# Patient Record
Sex: Male | Born: 1960 | Race: White | Hispanic: No | Marital: Married | Smoking: Former smoker
Health system: Southern US, Community
[De-identification: ages and names within clinical notes are randomized; demographics above are authoritative.]

## PROBLEM LIST (undated history)

## (undated) DIAGNOSIS — E785 Hyperlipidemia, unspecified: Secondary | ICD-10-CM

## (undated) DIAGNOSIS — K219 Gastro-esophageal reflux disease without esophagitis: Secondary | ICD-10-CM

## (undated) DIAGNOSIS — B059 Measles without complication: Secondary | ICD-10-CM

## (undated) DIAGNOSIS — F411 Generalized anxiety disorder: Secondary | ICD-10-CM

## (undated) DIAGNOSIS — Z889 Allergy status to unspecified drugs, medicaments and biological substances status: Secondary | ICD-10-CM

## (undated) HISTORY — DX: Measles without complication: B05.9

## (undated) HISTORY — DX: Allergy status to unspecified drugs, medicaments and biological substances: Z88.9

## (undated) HISTORY — DX: Hyperlipidemia, unspecified: E78.5

## (undated) HISTORY — DX: Generalized anxiety disorder: F41.1

## (undated) HISTORY — DX: Gastro-esophageal reflux disease without esophagitis: K21.9

---

## 2006-02-09 ENCOUNTER — Ambulatory Visit: Payer: Self-pay | Admitting: Internal Medicine

## 2006-03-11 ENCOUNTER — Ambulatory Visit: Payer: Self-pay | Admitting: Internal Medicine

## 2011-05-26 ENCOUNTER — Other Ambulatory Visit (INDEPENDENT_AMBULATORY_CARE_PROVIDER_SITE_OTHER): Payer: Self-pay

## 2011-05-26 ENCOUNTER — Ambulatory Visit (INDEPENDENT_AMBULATORY_CARE_PROVIDER_SITE_OTHER): Payer: Self-pay | Admitting: Internal Medicine

## 2011-05-26 ENCOUNTER — Ambulatory Visit (INDEPENDENT_AMBULATORY_CARE_PROVIDER_SITE_OTHER)
Admission: RE | Admit: 2011-05-26 | Discharge: 2011-05-26 | Disposition: A | Discharge status: Home / Self Care | Payer: Self-pay | Source: Ambulatory Visit | Attending: Internal Medicine | Admitting: Internal Medicine

## 2011-05-26 ENCOUNTER — Encounter: Payer: Self-pay | Admitting: Internal Medicine

## 2011-05-26 VITALS — BP 128/78 | HR 80 | Temp 98.2°F | Ht 69.0 in | Wt 183.0 lb

## 2011-05-26 DIAGNOSIS — R0989 Other specified symptoms and signs involving the circulatory and respiratory systems: Secondary | ICD-10-CM

## 2011-05-26 DIAGNOSIS — R0600 Dyspnea, unspecified: Secondary | ICD-10-CM

## 2011-05-26 DIAGNOSIS — J310 Chronic rhinitis: Secondary | ICD-10-CM | POA: Insufficient documentation

## 2011-05-26 DIAGNOSIS — R0609 Other forms of dyspnea: Secondary | ICD-10-CM

## 2011-05-26 LAB — CBC WITH DIFFERENTIAL/PLATELET
Basophils Relative: 0.5 % (ref 0.0–3.0)
Eosinophils Relative: 3.8 % (ref 0.0–5.0)
HCT: 37.8 % — ABNORMAL LOW (ref 39.0–52.0)
Lymphs Abs: 2 10*3/uL (ref 0.7–4.0)
MCV: 94.7 fl (ref 78.0–100.0)
Monocytes Absolute: 0.4 10*3/uL (ref 0.1–1.0)
Monocytes Relative: 6.9 % (ref 3.0–12.0)
Neutrophils Relative %: 57.3 % (ref 43.0–77.0)
RBC: 4 Mil/uL — ABNORMAL LOW (ref 4.22–5.81)
WBC: 6.3 10*3/uL (ref 4.5–10.5)

## 2011-05-26 LAB — BASIC METABOLIC PANEL
Chloride: 103 mEq/L (ref 96–112)
Potassium: 3.8 mEq/L (ref 3.5–5.1)

## 2011-05-26 MED ORDER — PREDNISONE (PAK) 10 MG PO TABS: ORAL_TABLET | ORAL | Status: AC

## 2011-05-26 NOTE — Assessment & Plan Note (Signed)
See instructions for specific recommendations which were reviewed directly with the patient who was given a copy with highlighter outlining the key components.  rx with one cycle of prednisone since not compliant with topical rx in the past

## 2011-05-26 NOTE — Assessment & Plan Note (Signed)
Symptoms are markedly disproportionate to objective findings and not clear this is a lung problem but pt does appear to have difficult airway management issues.   DDX of  difficult airways managment all start with A and  include Adherence, Ace Inhibitors, Acid Reflux, Active Sinus Disease, Alpha 1 Antitripsin deficiency, Anxiety masquerading as Airways dz,  ABPA,  allergy(esp in young), Aspiration (esp in elderly), Adverse effects of DPI,  Active smokers, plus two Bs  = Bronchiectasis and Beta blocker use..and one C= CHF   Active or acute sinusitis.  Allergy  and anxiety appear to be highest on the ddx so rec proceed with sinus ct and allergy profile

## 2011-05-26 NOTE — Progress Notes (Signed)
  Subjective:    Patient ID: Adrian Ayers, male    DOB: 06/30/1961, 50 y.o.   MRN: [id number]  HPI  68 yowm quit smoking 2007 and felt better also changed jobs then progressive doe so self referred to pulmonary clinic 05/26/2011   05/26/2011 Initial pulmonary office eval in EMR era cc indolent osnet x 5 years progressively worsening doe across a parking lot no real fluctuation though worse when humid but Sleeping ok without nocturnal  or early am exacerbation  of respiratory  c/o's.    Also denies any obvious fluctuation of symptoms with weather or environmental changes or other aggravating or alleviating factors except as outlined above.  Mod assoc nasal congestion/ obstructive symptoms not previously adeherent with nasal steroids. No classic lateralizing pleuritic or ex cp.     Review of Systems  Constitutional: Negative for fever, chills, activity change, appetite change and unexpected weight change.  HENT: Positive for congestion, rhinorrhea, sneezing and postnasal drip. Negative for sore throat, trouble swallowing, dental problem and voice change.   Eyes: Positive for visual disturbance.  Respiratory: Positive for shortness of breath. Negative for cough and choking.   Cardiovascular: Positive for chest pain. Negative for leg swelling.  Gastrointestinal: Negative for nausea, vomiting and abdominal pain.  Genitourinary: Negative for difficulty urinating.  Musculoskeletal: Positive for arthralgias.  Skin: Positive for rash.  Psychiatric/Behavioral: Negative for behavioral problems and confusion.       Objective:   Physical Exam  amb wm nad with nasal tone to voice  Wt 183 05/26/2011   HEENT: nl dentition,and orophanx.  Severe bilateral turbinate edema Nl external ear canals without cough reflex   NECK :  without JVD/Nodes/TM/ nl carotid upstrokes bilaterally   LUNGS: no acc muscle use, clear to A and P bilaterally without cough on insp or exp maneuvers   CV:  RRR  no  s3 or murmur or increase in P2, no edema   ABD:  soft and nontender with nl excursion in the supine position. No bruits or organomegaly, bowel sounds nl  MS:  warm without deformities, calf tenderness, cyanosis or clubbing  SKIN: warm and dry without lesions    NEURO:  alert, approp, no deficits       Assessment & Plan:

## 2011-05-26 NOTE — Patient Instructions (Signed)
Prednisone 10 mg take  4 each am x 2 days,   2 each am x 2 days,  1 each am x2days and stop  Please remember to go to the lab and x-ray department downstairs for your tests - we will call you with the results when then are available.   Please schedule a follow up office visit in 4 weeks, sooner if needed with pft's on return

## 2011-05-26 NOTE — Progress Notes (Signed)
Quick Note:  Spoke with pt and notified of results per Dr. Wert. Pt verbalized understanding and denied any questions.  ______ 

## 2011-05-27 ENCOUNTER — Ambulatory Visit (INDEPENDENT_AMBULATORY_CARE_PROVIDER_SITE_OTHER)
Admission: RE | Admit: 2011-05-27 | Discharge: 2011-05-27 | Disposition: A | Discharge status: Home / Self Care | Payer: Self-pay | Source: Ambulatory Visit | Attending: Internal Medicine | Admitting: Internal Medicine

## 2011-05-27 DIAGNOSIS — R0609 Other forms of dyspnea: Secondary | ICD-10-CM

## 2011-05-27 DIAGNOSIS — R0600 Dyspnea, unspecified: Secondary | ICD-10-CM

## 2011-05-27 DIAGNOSIS — R0989 Other specified symptoms and signs involving the circulatory and respiratory systems: Secondary | ICD-10-CM

## 2011-05-27 LAB — ALLERGY PROFILE REGION II-DC, DE, MD, ~~LOC~~, VA
Allergen, D pternoyssinus,d7: 0.11 kU/L (ref <0.35)
Alternaria Alternata: <0.1 kU/L (ref <0.35)
Aspergillus fumigatus, IgG: <0.1 kU/L (ref <0.35)
Bermuda Grass: 49.9 kU/L — ABNORMAL HIGH (ref <0.35)
Cat Dander: 1.72 kU/L — ABNORMAL HIGH (ref <0.35)
Cladosporium Herbarum: <0.1 kU/L (ref <0.35)
Dog Dander: 0.9 kU/L — ABNORMAL HIGH (ref <0.35)
IgE (Immunoglobulin E), Serum: 544 IU/mL — ABNORMAL HIGH (ref 0.0–180.0)
Johnson Grass: 38.2 kU/L — ABNORMAL HIGH (ref <0.35)
Lamb's Quarters: 8.99 kU/L — ABNORMAL HIGH (ref <0.35)

## 2011-05-27 NOTE — Progress Notes (Signed)
Quick Note:  Spoke with pt and notified of results per Dr. Wert. Pt verbalized understanding and denied any questions.  ______ 

## 2011-05-31 ENCOUNTER — Telehealth: Payer: Self-pay | Admitting: Internal Medicine

## 2011-05-31 NOTE — Telephone Encounter (Signed)
Spoke with pt and notified of results per Dr. Sherene Sires. Pt states that he is still having a lot of tightness and soreness in his chest and feels SOB now. He states that at the time of the last appt, he was feeling okay and is concerned b/c now having symptoms. I advised needs ov for eval and sched him to see [facility] at 3:15 tomorrow and advised ED sooner if needed. Pt verbalized understanding.

## 2011-05-31 NOTE — Telephone Encounter (Signed)
LMTCB

## 2011-06-01 ENCOUNTER — Ambulatory Visit: Payer: Self-pay | Admitting: Pulmonary Disease

## 2011-06-17 ENCOUNTER — Telehealth: Payer: Self-pay | Admitting: Internal Medicine

## 2011-06-17 NOTE — Telephone Encounter (Signed)
Error.  Made alternate appointment for pt.  Adrian Ayers

## 2011-06-23 ENCOUNTER — Ambulatory Visit (INDEPENDENT_AMBULATORY_CARE_PROVIDER_SITE_OTHER): Payer: Self-pay | Admitting: Internal Medicine

## 2011-06-23 DIAGNOSIS — R0600 Dyspnea, unspecified: Secondary | ICD-10-CM

## 2011-06-23 DIAGNOSIS — R0989 Other specified symptoms and signs involving the circulatory and respiratory systems: Secondary | ICD-10-CM

## 2011-06-23 DIAGNOSIS — R0609 Other forms of dyspnea: Secondary | ICD-10-CM

## 2011-06-23 LAB — PULMONARY FUNCTION TEST

## 2011-06-23 NOTE — Progress Notes (Signed)
PFT done today. 

## 2011-06-25 ENCOUNTER — Encounter: Payer: Self-pay | Admitting: Internal Medicine

## 2011-07-02 ENCOUNTER — Ambulatory Visit: Payer: Self-pay | Admitting: Internal Medicine

## 2011-07-02 ENCOUNTER — Telehealth: Payer: Self-pay | Admitting: Internal Medicine

## 2011-07-02 NOTE — Telephone Encounter (Signed)
Per MW- PFT's were reviewed and are inconclusive- needs to keep ov with MW 07/06/11 to discuss in further detail.  ATC pt and inform and NA, unable to leave msg since mailbox was full, WCB.

## 2011-07-06 ENCOUNTER — Encounter: Payer: Self-pay | Admitting: Internal Medicine

## 2011-07-06 NOTE — Progress Notes (Signed)
  Subjective:    Patient ID: Adrian Ayers, male    DOB: 05/04/1961, 50 y.o.   MRN: [id number]  HPI  69 yowm quit smoking 2007 and felt better also changed jobs then progressive doe so self referred to pulmonary clinic 05/26/2011   05/26/2011 Initial pulmonary office eval in EMR era cc indolent osnet x 5 years progressively worsening doe across a parking lot no real fluctuation though worse when humid but  rec Prednisone 10 mg take  4 each am x 2 days,   2 each am x 2 days,  1 each am x2days and stop Please remember to go to the lab and x-ray department downstairs for your tests - we will call you with the results when then are available.  Please schedule a follow up office visit in 4 weeks, sooner if needed with pft's on retur    07/06/2011 f/u ov/Keneshia Tena cc  Sleeping ok without nocturnal  or early am exacerbation  of respiratory  c/o's.    Also denies any obvious fluctuation of symptoms with weather or environmental changes or other aggravating or alleviating factors except as outlined above.  Mod assoc nasal congestion/ obstructive symptoms not previously adeherent with nasal steroids. No classic lateralizing pleuritic or ex cp.          Objective:   Physical Exam  amb wm nad with nasal tone to voice  Wt 183 05/26/2011 > 07/06/2011   HEENT: nl dentition,and orophanx.  Severe bilateral turbinate edema Nl external ear canals without cough reflex   NECK :  without JVD/Nodes/TM/ nl carotid upstrokes bilaterally   LUNGS: no acc muscle use, clear to A and P bilaterally without cough on insp or exp maneuvers   CV:  RRR  no s3 or murmur or increase in P2, no edema   ABD:  soft and nontender with nl excursion in the supine position. No bruits or organomegaly, bowel sounds nl  MS:  warm without deformities, calf tenderness, cyanosis or clubbing  SKIN: warm and dry without lesions    NEURO:  alert, approp, no deficits       Assessment & Plan:

## 2011-08-04 ENCOUNTER — Encounter: Payer: Self-pay | Admitting: Internal Medicine

## 2011-08-04 ENCOUNTER — Ambulatory Visit (INDEPENDENT_AMBULATORY_CARE_PROVIDER_SITE_OTHER): Payer: Self-pay | Admitting: Internal Medicine

## 2011-08-04 DIAGNOSIS — R0609 Other forms of dyspnea: Secondary | ICD-10-CM

## 2011-08-04 DIAGNOSIS — J310 Chronic rhinitis: Secondary | ICD-10-CM

## 2011-08-04 DIAGNOSIS — R0989 Other specified symptoms and signs involving the circulatory and respiratory systems: Secondary | ICD-10-CM

## 2011-08-04 DIAGNOSIS — R0600 Dyspnea, unspecified: Secondary | ICD-10-CM

## 2011-08-04 MED ORDER — PREDNISONE (PAK) 10 MG PO TABS: ORAL_TABLET | ORAL | Status: AC

## 2011-08-04 MED ORDER — MONTELUKAST SODIUM 10 MG PO TABS: 10.0000 mg | ORAL_TABLET | Freq: Every day | ORAL | Status: DC

## 2011-08-04 MED ORDER — FAMOTIDINE 20 MG PO TABS: ORAL_TABLET | ORAL | Status: DC

## 2011-08-04 NOTE — Assessment & Plan Note (Signed)
Trial of singulair then referral to allergy

## 2011-08-04 NOTE — Patient Instructions (Addendum)
Prilosec 20 mg Take 30-60 min before first meal of the day and Add pepcid 20mg  one after supper for a full month to see if it helps your chest pain and in meantime pay attention to the pattern - when it the best, when is it the worst.   GERD (REFLUX)  is an extremely common cause of respiratory symptoms, many times with no significant heartburn at all.    It can be treated with medication, but also with lifestyle changes including avoidance of late meals, excessive alcohol, smoking cessation, and avoid fatty foods, chocolate, peppermint, colas, red wine, and acidic juices such as orange juice.  NO MINT OR MENTHOL PRODUCTS SO NO COUGH DROPS  USE SUGARLESS CANDY INSTEAD (jolley ranchers or Stover's)  NO OIL BASED VITAMINS - use powdered substitutes.    Singulair 10 mg one each pm (maintain on it to control allergies )  Prednisone 10 mg take  4 each am x 2 days,   2 each am x 2 days,  1 each am x2days and stop  - the extent you improve is the part that's your allergies just like working on a fuel system on a car dramatically improves your problem you don't need to start   If not doing better with breathing or sinus problems you need to be referred to an allergist. We have one here or you can return to the one you've seen previously.

## 2011-08-04 NOTE — Progress Notes (Signed)
  Subjective:    Patient ID: Adrian Ayers, male    DOB: Apr 04, 1961    MRN: [id number]  HPI  61 yowm quit smoking 2007 and felt better also changed jobs then progressive doe so self referred to pulmonary clinic 05/26/2011.  Previous eval for allergy was Positive but could not  take the shots   05/26/2011 Initial pulmonary office eval in EMR era cc indolent onset x 5 years progressively worsening doe across a parking lot no real fluctuation though worse when humid but  rec Prednisone 10 mg take  4 each am x 2 days,   2 each am x 2 days,  1 each am x2days and stop     08/04/2011 Verdelle Valtierra/ cc some better while on prednisone and maintaining on ppi 15 min before bfast  and cp daily x 5 years seems worse at the end of the day gen anterior not pleurtic L > R - main concern is persistent nasal congestion. No purulent secretions.  Sleeping ok without nocturnal  or early am exacerbation  of respiratory  c/o's.    Also denies any obvious fluctuation of symptoms with weather or environmental changes or other aggravating or alleviating factors except as outlined above.  Mod assoc nasal congestion/ obstructive symptoms not previously adeherent with nasal steroids. No classic lateralizing pleuritic or ex cp.          Objective:   Physical Exam  amb wm nad with nasal tone to voice   Wt 183 05/26/2011 >  183 08/04/2011   HEENT: nl dentition,and orophanx.  Severe bilateral turbinate edema Nl external ear canals without cough reflex   NECK :  without JVD/Nodes/TM/ nl carotid upstrokes bilaterally   LUNGS: no acc muscle use, clear to A and P bilaterally without cough on insp or exp maneuvers   CV:  RRR  no s3 or murmur or increase in P2, no edema   ABD:  soft and nontender with nl excursion in the supine position. No bruits or organomegaly, bowel sounds nl  MS:  warm without deformities, calf tenderness, cyanosis or clubbing    cxr 05/26/11 No acute intrathoracic abnormality is noted.     Assessment & Plan:

## 2011-08-04 NOTE — Assessment & Plan Note (Addendum)
Symptoms are markedly disproportionate to objective findings and not clear this is a lung problem but pt does appear to have difficult airway management issues.   DDX of  difficult airways managment all start with A and  include Adherence, Ace Inhibitors, Acid Reflux, Active Sinus Disease, Alpha 1 Antitripsin deficiency, Anxiety masquerading as Airways dz,  ABPA,  allergy(esp in young), Aspiration (esp in elderly), Adverse effects of DPI,  Active smokers, plus two Bs  = Bronchiectasis and Beta blocker use..and one C= CHF   ? Allergy / asthma > rx short course of prednisone and start trial of singulair  ? Anxiety > dx of exclusion  ? GERD assoc with atypical cp  > trial of max gerd rx and diet

## 2013-04-04 NOTE — Progress Notes (Signed)
This encounter was created in error - please disregard.

## 2013-10-06 IMAGING — CT CT PARANASAL SINUSES LIMITED
1 series · 16 of 27 positions shown, 20 images · non-contrast
Comparison: 05/26/2011 chest x-ray

CLINICAL DATA: Chronic cough and sinus drainage

CT LIMITED SINUSES WITHOUT CONTRAST
TECHNIQUE: Multidetector CT images of the paranasal sinuses were
obtained in a single plane without contrast.

[Series 3: ltd sinus 3.0 h30s · axial · 0.32mm/px · z∈[+1253,+1405]mm · 16 of 27 slices shown, 20 images]
[im 2/27  brain]
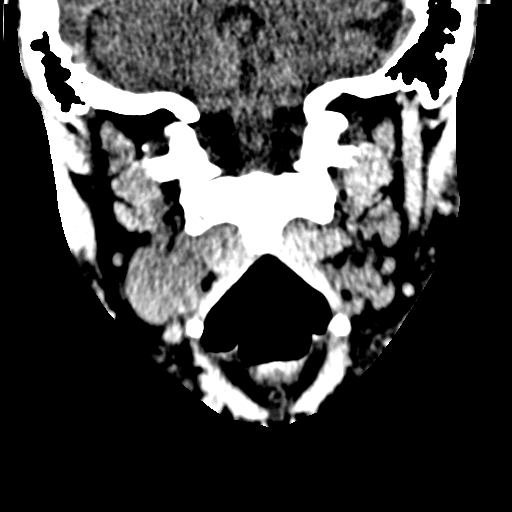
[im 2/27  bone]
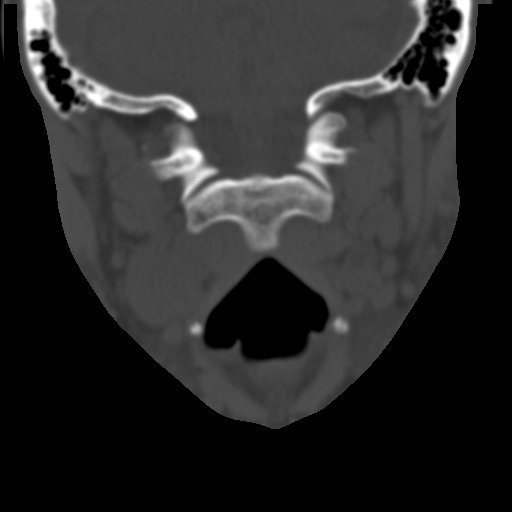
[im 4/27  bone]
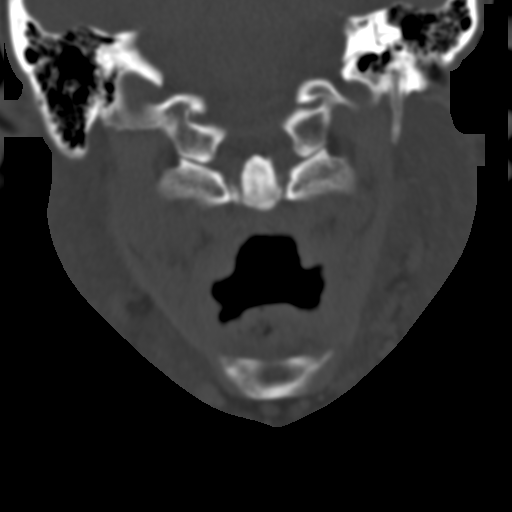
[im 5/27  bone]
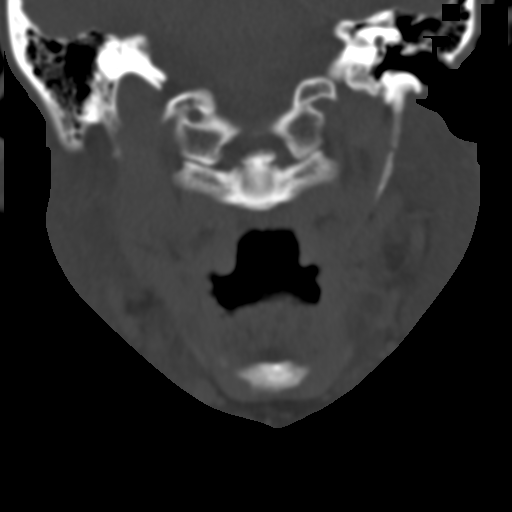
[im 7/27  bone]
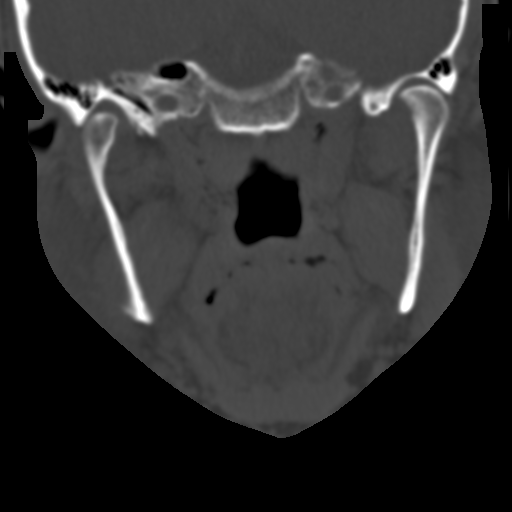
[im 9/27  brain]
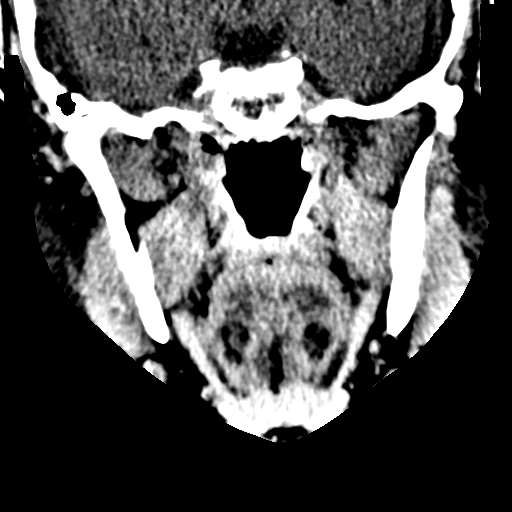
[im 9/27  bone]
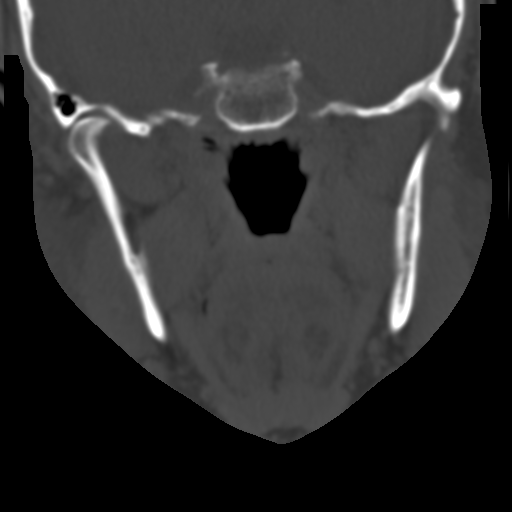
[im 10/27  bone]
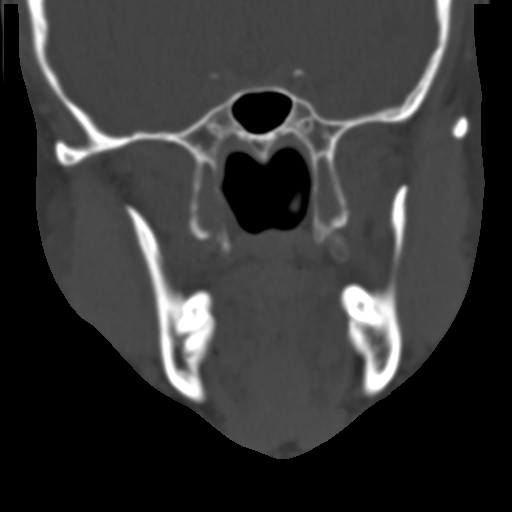
[im 12/27  bone]
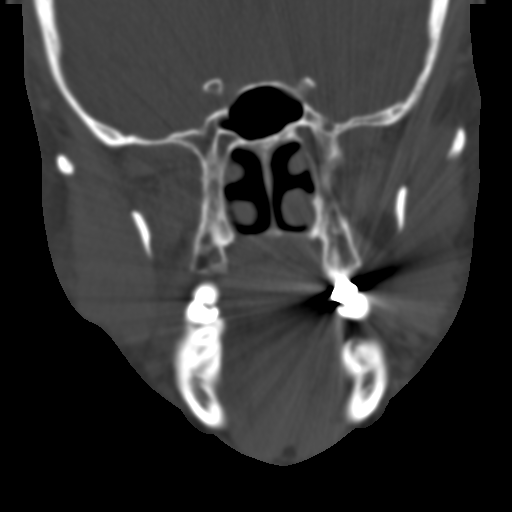
[im 13/27  bone]
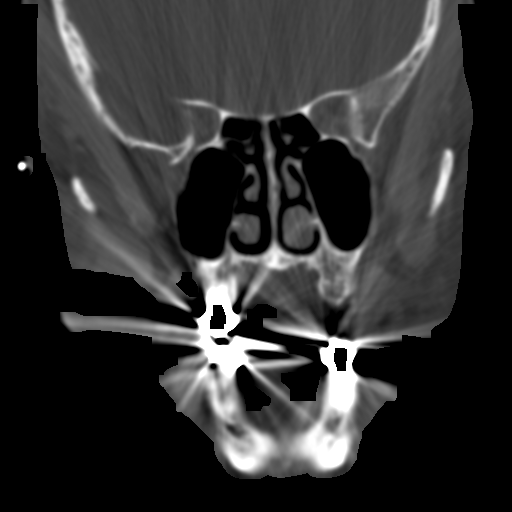
[im 15/27  brain]
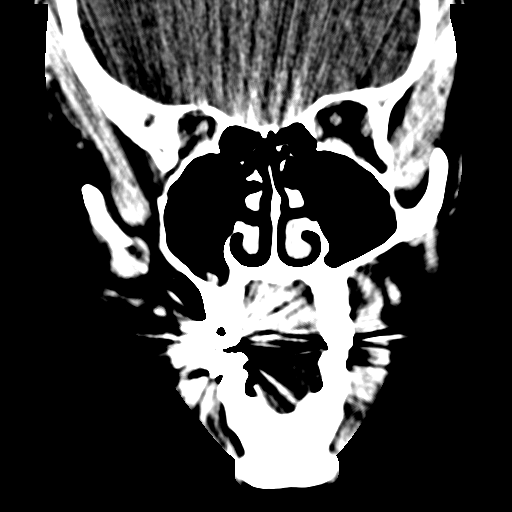
[im 15/27  bone]
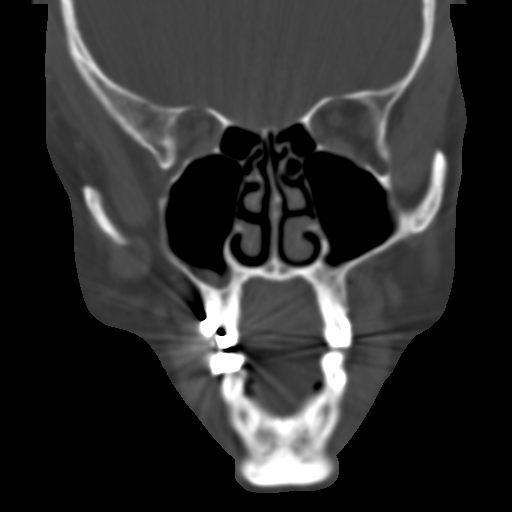
[im 16/27  bone]
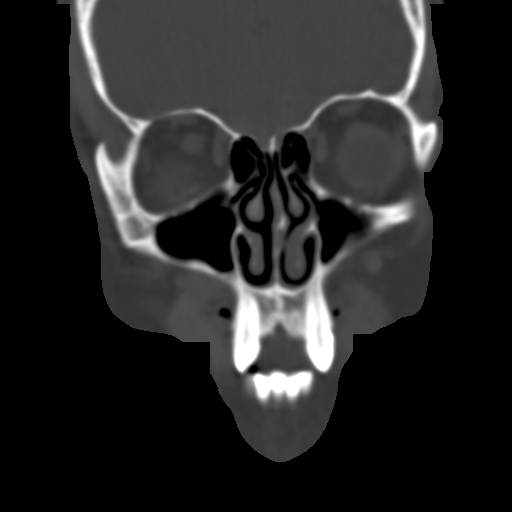
[im 18/27  bone]
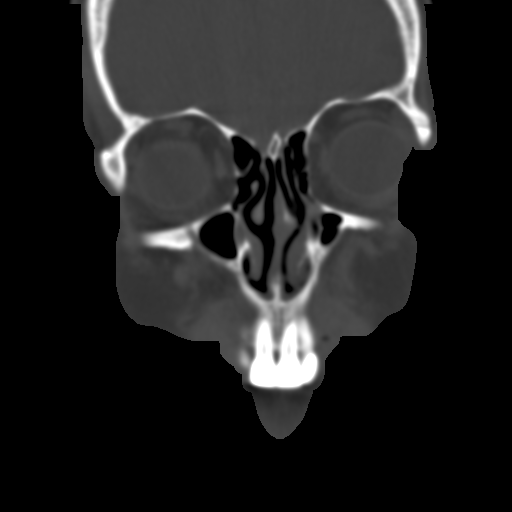
[im 19/27  bone]
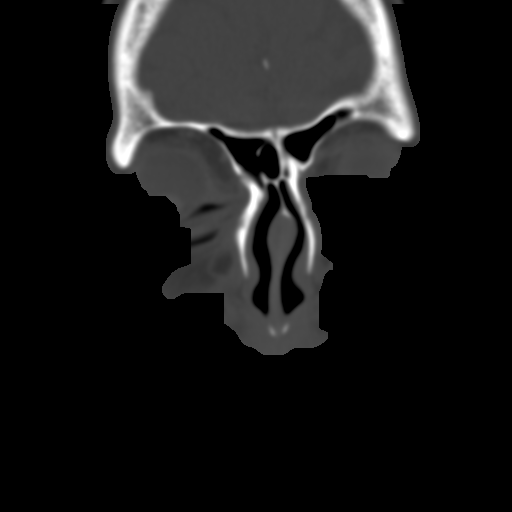
[im 21/27  brain]
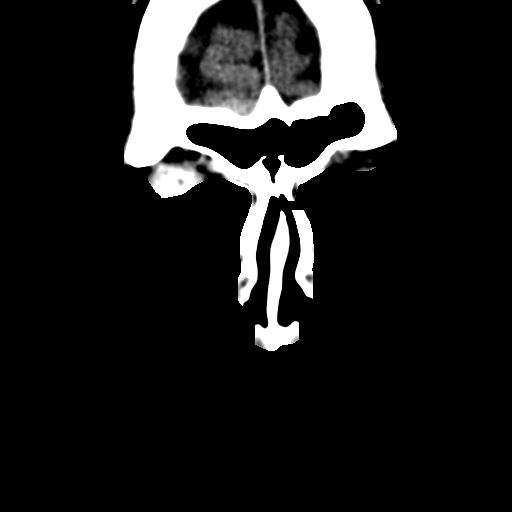
[im 21/27  bone]
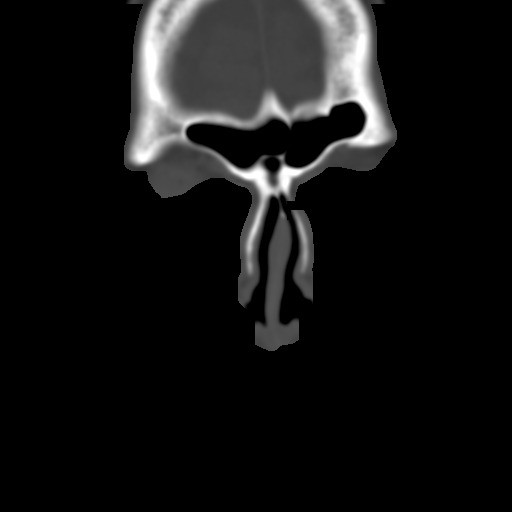
[im 23/27  bone]
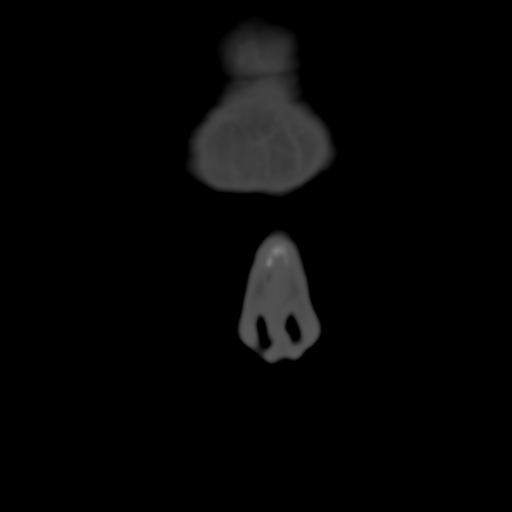
[im 24/27  bone]
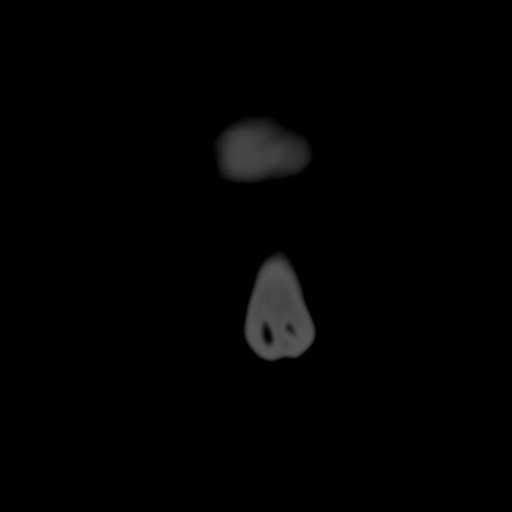
[im 26/27  bone]
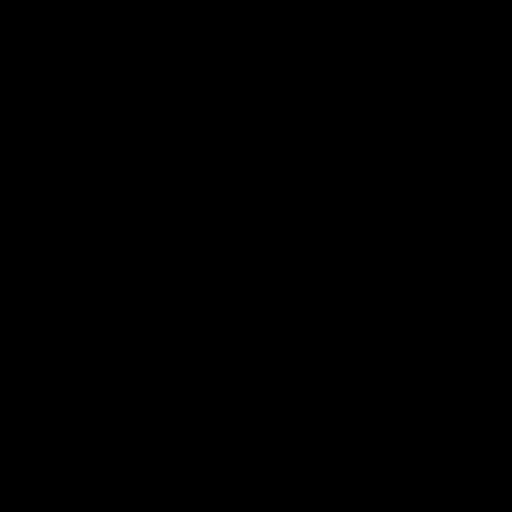

[16 of 27 positions shown; findings below may reference images not displayed]

FINDINGS: Frontal, ethmoid, maxillary, and sphenoid sinuses are
clear.  Minimal right maxillary inferior mucosal thickening, image
15.  No significant sinus opacification or sinus air fluid level to
suggest significant long-term chronic disease or acute sinusitis.
Ostiomeatal complex anatomy is patent and symmetric, image 17.
Turbinates are symmetric.  Slight septal deviation to the left.
Dental hardware creates streak artifact.

No acute facial bony trauma evident.  Symmetric orbits.
IMPRESSION: Minimal right maxillary floor mucosal thickening otherwise clear
paranasal sinuses.  No significant chronic or acute sinus disease.

## 2017-04-29 DIAGNOSIS — Z86010 Personal history of colonic polyps: Secondary | ICD-10-CM | POA: Insufficient documentation

## 2017-04-29 DIAGNOSIS — Z860101 Personal history of adenomatous and serrated colon polyps: Secondary | ICD-10-CM | POA: Insufficient documentation

## 2017-08-26 ENCOUNTER — Encounter: Payer: Self-pay | Admitting: Urgent Care

## 2017-08-26 ENCOUNTER — Ambulatory Visit (INDEPENDENT_AMBULATORY_CARE_PROVIDER_SITE_OTHER): Payer: Self-pay | Admitting: Urgent Care

## 2017-08-26 ENCOUNTER — Ambulatory Visit (INDEPENDENT_AMBULATORY_CARE_PROVIDER_SITE_OTHER): Payer: Self-pay

## 2017-08-26 VITALS — BP 122/72 | HR 72 | Temp 98.2°F | Resp 17 | Ht 69.5 in | Wt 189.0 lb

## 2017-08-26 DIAGNOSIS — R0789 Other chest pain: Secondary | ICD-10-CM

## 2017-08-26 DIAGNOSIS — R0781 Pleurodynia: Secondary | ICD-10-CM

## 2017-08-26 DIAGNOSIS — S29011A Strain of muscle and tendon of front wall of thorax, initial encounter: Secondary | ICD-10-CM

## 2017-08-26 MED ORDER — TRAMADOL-ACETAMINOPHEN 37.5-325 MG PO TABS: 1.0000 | ORAL_TABLET | Freq: Three times a day (TID) | ORAL | 0 refills | Status: DC | PRN

## 2017-08-26 NOTE — Patient Instructions (Addendum)
Chest Wall Pain Chest wall pain is pain in or around the bones and muscles of your chest. Sometimes, an injury causes this pain. Sometimes, the cause may not be known. This pain may take several weeks or longer to get better. Follow these instructions at home: Pay attention to any changes in your symptoms. Take these actions to help with your pain:  Rest as told by your health care provider.  Avoid activities that cause pain. These include any activities that use your chest muscles or your abdominal and side muscles to lift heavy items.  If directed, apply ice to the painful area: ? Put ice in a plastic bag. ? Place a towel between your skin and the bag. ? Leave the ice on for 20 minutes, 2-3 times per day.  Take over-the-counter and prescription medicines only as told by your health care provider.  Do not use tobacco products, including cigarettes, chewing tobacco, and e-cigarettes. If you need help quitting, ask your health care provider.  Keep all follow-up visits as told by your health care provider. This is important.  Contact a health care provider if:  You have a fever.  Your chest pain becomes worse.  You have new symptoms. Get help right away if:  You have nausea or vomiting.  You feel sweaty or light-headed.  You have a cough with phlegm (sputum) or you cough up blood.  You develop shortness of breath. This information is not intended to replace advice given to you by your health care provider. Make sure you discuss any questions you have with your health care provider. Document Released: 07/05/2005 Document Revised: 11/13/2015 Document Reviewed: 09/30/2014 Elsevier Interactive Patient Education  2018 [employer]     IF you received an x-ray today, you will receive an invoice from [facility]. Please contact [facility] at [phone] with questions or concerns regarding your invoice.   IF you received labwork today, you will receive an  invoice from [facility]. Please contact [facility] at [phone] with questions or concerns regarding your invoice.   Our billing staff will not be able to assist you with questions regarding bills from these companies.  You will be contacted with the lab results as soon as they are available. The fastest way to get your results is to activate your My Chart account. Instructions are located on the last page of this paperwork. If you have not heard from us regarding the results in 2 weeks, please contact this office.

## 2017-08-26 NOTE — Progress Notes (Signed)
[facility] [address], Medication called in, spoke with pharmacist Marchelle FolksAmanda.

## 2017-08-26 NOTE — Progress Notes (Signed)
  MRN: [id number] DOB: 19-Apr-1961  Subjective:   Adrian Ayers is a 57 y.o. male presenting for 1 day history of right sided chest/rib injury while pulling a wrench very vigorously. Pain is sharp, worse with twisting, bending of the torso, elicited with deep breathing. Has associated swelling. Denies redness, bruising, bony deformity. Has not tried any medications for relief. Denies smoking cigarettes or drinking alcohol.   Adrian Ayers has a current medication list which includes the following prescription(s): omeprazole, cholecalciferol, and UNABLE TO FIND. Also is allergic to feldene [piroxicam].  Adrian Ayers  has a past medical history of Multiple allergies. Denies past surgical history. Denies family history of cancer, diabetes, HTN, HL, heart disease, stroke, mental illness.   Objective:   Vitals: BP 122/72   Pulse 72   Temp 98.2 F (36.8 C) (Oral)   Resp 17   Ht 5' 9.5" (1.765 m)   Wt 189 lb (85.7 kg)   SpO2 98%   BMI 27.51 kg/m   Physical Exam  Constitutional: He is oriented to person, place, and time. He appears well-developed and well-nourished.  Cardiovascular: Normal rate, regular rhythm and intact distal pulses. Exam reveals no gallop and no friction rub.  No murmur heard. Pulmonary/Chest: No respiratory distress. He has no wheezes. He has no rales. He exhibits tenderness. He exhibits no crepitus, no edema, no deformity, no swelling and no retraction.  No evidence of ecchymosis.    Neurological: He is alert and oriented to person, place, and time.   Dg Chest 2 View  Result Date: 08/26/2017 CLINICAL DATA:  right sided sternal/rib pain x yesterday EXAM: CHEST  2 VIEW COMPARISON:  05/26/2011 FINDINGS: The heart size and mediastinal contours are within normal limits. Both lungs are clear. No pleural effusion or pneumothorax. The visualized skeletal structures are unremarkable. IMPRESSION: No active cardiopulmonary disease. Electronically Signed   By: Amie Portlandavid  Ormond M.D.   On:  08/26/2017 09:39   Assessment and Plan :   Muscle strain of chest wall, initial encounter  Sternal pain - Plan: DG Chest 2 View  Rib pain on right side - Plan: DG Chest 2 View  Patient has anaphylaxis with piroxicam. Will use Ultracet for chest wall pain. Return-to-clinic precautions discussed, patient verbalized understanding.   Wallis BambergMario Donella Pascarella, PA-C Primary Care at [facility] [phone] 08/26/2017  9:19 AM

## 2018-02-27 ENCOUNTER — Encounter: Payer: Self-pay | Admitting: Urgent Care

## 2018-02-27 ENCOUNTER — Ambulatory Visit: Payer: Self-pay | Admitting: Urgent Care

## 2018-02-27 VITALS — BP 132/85 | HR 72 | Temp 98.2°F | Resp 18 | Ht 69.5 in | Wt 193.6 lb

## 2018-02-27 DIAGNOSIS — S20212A Contusion of left front wall of thorax, initial encounter: Secondary | ICD-10-CM

## 2018-02-27 DIAGNOSIS — R0781 Pleurodynia: Secondary | ICD-10-CM

## 2018-02-27 DIAGNOSIS — R0789 Other chest pain: Secondary | ICD-10-CM

## 2018-02-27 MED ORDER — TRAMADOL-ACETAMINOPHEN 37.5-325 MG PO TABS: 1.0000 | ORAL_TABLET | Freq: Three times a day (TID) | ORAL | 0 refills | Status: DC | PRN

## 2018-02-27 MED ORDER — CYCLOBENZAPRINE HCL 5 MG PO TABS: 5.0000 mg | ORAL_TABLET | Freq: Every day | ORAL | 1 refills | Status: DC

## 2018-02-27 NOTE — Progress Notes (Signed)
   MRN: [id number] DOB: [birth year]  Subjective:   Adrian Ayers is a 57 y.o. male presenting for 3-day history of persistent left lower and lateral chest wall pain.  Patient reports that he was [facility].  He was laying on his back and had to twist toward his left side was pressed firmly against an iron pipe.  States that since then he has had persistent albeit intermittent chest wall pain.  Denies heart racing, palpitations, shortness of breath, diaphoresis, radiation of his chest pain.  He admits that it is difficult for him to take a deep breath from the pain, also has pain when he sneezes over the said area.  Has a previous anaphylactic reaction to piroxicam.  Has previously had a similar episode of a muscle strain, chest wall pain.  For that episode, we used Ultracet successfully.  He is requesting a refill of that.  This medication was last prescribed 08/26/2017.  He reports that he quit smoking about 12 years ago. His smoking use included cigarettes. He has a 15.00 pack-year smoking history. He has never used smokeless tobacco. He reports that he drinks alcohol. He reports that he does not use drugs.   Adrian Ayers has a current medication list which includes the following prescription(s): cholecalciferol, omeprazole, tramadol-acetaminophen, and UNABLE TO FIND. Also is allergic to feldene [piroxicam].  Adrian Ayers  has a past medical history of Multiple allergies. Denies past surgical history.   Objective:   Vitals: BP 132/85   Pulse 72   Temp 98.2 F (36.8 C) (Oral)   Resp 18   Ht 5' 9.5" (1.765 m)   Wt 193 lb 9.6 oz (87.8 kg)   SpO2 98%   BMI 28.18 kg/m   Physical Exam  Constitutional: He is oriented to person, place, and time. He appears well-developed and well-nourished.  Cardiovascular: Normal rate, regular rhythm, normal heart sounds and intact distal pulses. Exam reveals no gallop and no friction rub.  No murmur heard. Pulmonary/Chest: Effort normal and breath sounds  normal. No stridor. No respiratory distress. He has no wheezes. He has no rales. He exhibits tenderness (over area depicted) and bony tenderness (over area depicted). He exhibits no mass, no laceration, no crepitus, no edema, no deformity, no swelling and no retraction.    Neurological: He is alert and oriented to person, place, and time.   Assessment and Plan :   Chest wall pain  Rib pain on left side  Rib contusion, left, initial encounter  Counseled patient that an EKG could be very reassuring to rule out a cardiopulmonary source for his chest pain.  He refused this even with the 55% discount in our clinic office.  ER precautions reviewed with patient.  Counseled on conservative management, will use Tylenol and Ultracet given his anaphylaxis with piroxicam.  Return to clinic precautions reviewed.  Wallis BambergMario Fransheska Willingham, PA-C Primary Care at [facility] [phone] 02/27/2018  11:42 AM

## 2018-02-27 NOTE — Patient Instructions (Addendum)
You may take 500mg  Tylenol every 6 hours for pain and inflammation.  If your chest pain worsens and you have sweating, belly pain, neck pain, arm pain, difficulty breathing together with it then it may be your heart and I need to do report to the emergency room immediately.  Otherwise if the nature is unchanged and not being helped by rest and using the medications were prescribing today, then I encourage you to come back so that we can continue figuring out what the source of pain is.    Chest Wall Pain Chest wall pain is pain in or around the bones and muscles of your chest. Sometimes, an injury causes this pain. Sometimes, the cause may not be known. This pain may take several weeks or longer to get better. Follow these instructions at home: Pay attention to any changes in your symptoms. Take these actions to help with your pain:  Rest as told by your health care provider.  Avoid activities that cause pain. These include any activities that use your chest muscles or your abdominal and side muscles to lift heavy items.  If directed, apply ice to the painful area: ? Put ice in a plastic bag. ? Place a towel between your skin and the bag. ? Leave the ice on for 20 minutes, 2-3 times per day.  Take over-the-counter and prescription medicines only as told by your health care provider.  Do not use tobacco products, including cigarettes, chewing tobacco, and e-cigarettes. If you need help quitting, ask your health care provider.  Keep all follow-up visits as told by your health care provider. This is important.  Contact a health care provider if:  You have a fever.  Your chest pain becomes worse.  You have new symptoms. Get help right away if:  You have nausea or vomiting.  You feel sweaty or light-headed.  You have a cough with phlegm (sputum) or you cough up blood.  You develop shortness of breath. This information is not intended to replace advice given to you by your health  care provider. Make sure you discuss any questions you have with your health care provider. Document Released: 07/05/2005 Document Revised: 11/13/2015 Document Reviewed: 09/30/2014 Elsevier Interactive Patient Education  2018 [employer]     IF you received an x-ray today, you will receive an invoice from [facility]. Please contact [facility] at [phone] with questions or concerns regarding your invoice.   IF you received labwork today, you will receive an invoice from [facility]. Please contact [facility] at [phone] with questions or concerns regarding your invoice.   Our billing staff will not be able to assist you with questions regarding bills from these companies.  You will be contacted with the lab results as soon as they are available. The fastest way to get your results is to activate your My Chart account. Instructions are located on the last page of this paperwork. If you have not heard from us regarding the results in 2 weeks, please contact this office.

## 2019-10-17 ENCOUNTER — Ambulatory Visit (INDEPENDENT_AMBULATORY_CARE_PROVIDER_SITE_OTHER): Payer: 59 | Admitting: Registered Nurse

## 2019-10-17 ENCOUNTER — Telehealth: Payer: Self-pay | Admitting: Registered Nurse

## 2019-10-17 ENCOUNTER — Other Ambulatory Visit: Payer: Self-pay

## 2019-10-17 ENCOUNTER — Encounter: Payer: Self-pay | Admitting: Registered Nurse

## 2019-10-17 VITALS — BP 133/82 | HR 71 | Temp 97.8°F | Ht 69.5 in | Wt 196.2 lb

## 2019-10-17 DIAGNOSIS — Z0000 Encounter for general adult medical examination without abnormal findings: Secondary | ICD-10-CM

## 2019-10-17 DIAGNOSIS — Z23 Encounter for immunization: Secondary | ICD-10-CM | POA: Diagnosis not present

## 2019-10-17 DIAGNOSIS — Z13220 Encounter for screening for lipoid disorders: Secondary | ICD-10-CM

## 2019-10-17 DIAGNOSIS — Z7689 Persons encountering health services in other specified circumstances: Secondary | ICD-10-CM

## 2019-10-17 DIAGNOSIS — Z1329 Encounter for screening for other suspected endocrine disorder: Secondary | ICD-10-CM | POA: Diagnosis not present

## 2019-10-17 DIAGNOSIS — Z13228 Encounter for screening for other metabolic disorders: Secondary | ICD-10-CM

## 2019-10-17 DIAGNOSIS — Z1159 Encounter for screening for other viral diseases: Secondary | ICD-10-CM

## 2019-10-17 DIAGNOSIS — Z130 Encounter for screening for diseases of the blood and blood-forming organs and certain disorders involving the immune mechanism: Secondary | ICD-10-CM

## 2019-10-17 NOTE — Telephone Encounter (Signed)
Almira Coaster spoke with patient and he understood.

## 2019-10-17 NOTE — Patient Instructions (Signed)
° ° ° °  If you have lab work done today you will be contacted with your lab results within the next 2 weeks.  If you have not heard from us then please contact us. The fastest way to get your results is to register for My Chart. ° ° °IF you received an x-ray today, you will receive an invoice from [facility]. Please contact [facility] at [phone] with questions or concerns regarding your invoice.  ° °IF you received labwork today, you will receive an invoice from [facility]. Please contact [facility] at [phone] with questions or concerns regarding your invoice.  ° °Our billing staff will not be able to assist you with questions regarding bills from these companies. ° °You will be contacted with the lab results as soon as they are available. The fastest way to get your results is to activate your My Chart account. Instructions are located on the last page of this paperwork. If you have not heard from us regarding the results in 2 weeks, please contact this office. °  ° ° ° °

## 2019-10-17 NOTE — Telephone Encounter (Signed)
Pt is needing several vaccinations. I made the appointment on 11/05/19. He is wanting to know if that's ok or should he push it out further: please advise at [phone]

## 2019-10-18 LAB — CBC WITH DIFFERENTIAL/PLATELET
Basophils Absolute: 0.1 10*3/uL (ref 0.0–0.2)
Basos: 1 %
EOS (ABSOLUTE): 0.3 10*3/uL (ref 0.0–0.4)
Eos: 4 %
Hematocrit: 40.7 % (ref 37.5–51.0)
Hemoglobin: 14.2 g/dL (ref 13.0–17.7)
Immature Grans (Abs): 0 10*3/uL (ref 0.0–0.1)
Immature Granulocytes: 0 %
Lymphocytes Absolute: 2.5 10*3/uL (ref 0.7–3.1)
Lymphs: 37 %
MCH: 32.3 pg (ref 26.6–33.0)
MCHC: 34.9 g/dL (ref 31.5–35.7)
MCV: 93 fL (ref 79–97)
Monocytes Absolute: 0.6 10*3/uL (ref 0.1–0.9)
Monocytes: 8 %
Neutrophils Absolute: 3.3 10*3/uL (ref 1.4–7.0)
Neutrophils: 50 %
Platelets: 253 10*3/uL (ref 150–450)
RBC: 4.4 x10E6/uL (ref 4.14–5.80)
RDW: 12.2 % (ref 11.6–15.4)
WBC: 6.7 10*3/uL (ref 3.4–10.8)

## 2019-10-18 LAB — COMPREHENSIVE METABOLIC PANEL
ALT: 43 IU/L (ref 0–44)
AST: 27 IU/L (ref 0–40)
Albumin/Globulin Ratio: 1.9 (ref 1.2–2.2)
Albumin: 5 g/dL — ABNORMAL HIGH (ref 3.8–4.9)
Alkaline Phosphatase: 94 IU/L (ref 39–117)
BUN/Creatinine Ratio: 15 (ref 9–20)
BUN: 12 mg/dL (ref 6–24)
Bilirubin Total: 0.3 mg/dL (ref 0.0–1.2)
CO2: 19 mmol/L — ABNORMAL LOW (ref 20–29)
Calcium: 9.9 mg/dL (ref 8.7–10.2)
Chloride: 102 mmol/L (ref 96–106)
Creatinine, Ser: 0.81 mg/dL (ref 0.76–1.27)
GFR calc Af Amer: 114 mL/min/{1.73_m2} (ref >59)
GFR calc non Af Amer: 99 mL/min/{1.73_m2} (ref >59)
Globulin, Total: 2.6 g/dL (ref 1.5–4.5)
Glucose: 97 mg/dL (ref 65–99)
Potassium: 4.3 mmol/L (ref 3.5–5.2)
Sodium: 136 mmol/L (ref 134–144)
Total Protein: 7.6 g/dL (ref 6.0–8.5)

## 2019-10-18 LAB — HEMOGLOBIN A1C
Est. average glucose Bld gHb Est-mCnc: 117 mg/dL
Hgb A1c MFr Bld: 5.7 % — ABNORMAL HIGH (ref 4.8–5.6)

## 2019-10-18 LAB — LIPID PANEL
Chol/HDL Ratio: 6.5 ratio — ABNORMAL HIGH (ref 0.0–5.0)
Cholesterol, Total: 239 mg/dL — ABNORMAL HIGH (ref 100–199)
HDL: 37 mg/dL — ABNORMAL LOW (ref >39)
LDL Chol Calc (NIH): 144 mg/dL — ABNORMAL HIGH (ref 0–99)
Triglycerides: 319 mg/dL — ABNORMAL HIGH (ref 0–149)
VLDL Cholesterol Cal: 58 mg/dL — ABNORMAL HIGH (ref 5–40)

## 2019-10-18 LAB — HEPATITIS C ANTIBODY: Hep C Virus Ab: <0.1 s/co ratio (ref 0.0–0.9)

## 2019-10-18 LAB — TSH: TSH: 3.19 u[IU]/mL (ref 0.450–4.500)

## 2019-10-18 LAB — HIV ANTIBODY (ROUTINE TESTING W REFLEX): HIV Screen 4th Generation wRfx: NONREACTIVE

## 2019-10-23 ENCOUNTER — Encounter: Payer: Self-pay | Admitting: Registered Nurse

## 2019-10-23 DIAGNOSIS — E782 Mixed hyperlipidemia: Secondary | ICD-10-CM

## 2019-10-23 MED ORDER — ATORVASTATIN CALCIUM 40 MG PO TABS: 40.0000 mg | ORAL_TABLET | Freq: Every day | ORAL | 3 refills | Status: DC

## 2019-10-29 NOTE — Progress Notes (Signed)
Established Patient Office Visit  Subjective:  Patient ID: Adrian Ayers, male    DOB: 1960/08/18  Age: 59 y.o. MRN: [id number]  CC:  Chief Complaint  Patient presents with  . Transitions Of Care    establish care and get an physical    HPI Adrian Ayers presents for CPE and labs. Establishing care today  Feels well, no complaints  Needs tdap and pna vaccines today.  Hx of GERD, managed with lifestyle changes and monitoring diet.   Past Medical History:  Diagnosis Date  . GERD (gastroesophageal reflux disease)   . Multiple allergies     History reviewed. No pertinent surgical history.  Family History  Problem Relation Age of Onset  . Asthma Maternal Grandfather   . Heart disease Maternal Grandfather   . Lung cancer Father        smoked    Social History   Socioeconomic History  . Marital status: Married    Spouse name: Not on file  . Number of children: 3  . Years of education: Not on file  . Highest education level: Not on file  Occupational History  . Occupation: heating and air business x 30 yrs  Tobacco Use  . Smoking status: Former Smoker    Packs/day: 1.00    Years: 15.00    Pack years: 15.00    Types: Cigarettes    Quit date: 11/16/2005    Years since quitting: 13.9  . Smokeless tobacco: Never Used  Substance and Sexual Activity  . Alcohol use: Yes    Comment: occ  . Drug use: No  . Sexual activity: Not on file  Other Topics Concern  . Not on file  Social History Narrative  . Not on file   Social Determinants of Health   Financial Resource Strain:   . Difficulty of Paying Living Expenses:   Food Insecurity:   . Worried About Charity fundraiser in the Last Year:   . Arboriculturist in the Last Year:   Transportation Needs:   . Film/video editor (Medical):   Marland Kitchen Lack of Transportation (Non-Medical):   Physical Activity:   . Days of Exercise per Week:   . Minutes of Exercise per Session:   Stress:   . Feeling of Stress :     Social Connections:   . Frequency of Communication with Friends and Family:   . Frequency of Social Gatherings with Friends and Family:   . Attends Religious Services:   . Active Member of Clubs or Organizations:   . Attends Archivist Meetings:   Marland Kitchen Marital Status:   Intimate Partner Violence:   . Fear of Current or Ex-Partner:   . Emotionally Abused:   Marland Kitchen Physically Abused:   . Sexually Abused:     Outpatient Medications Prior to Visit  Medication Sig Dispense Refill  . omeprazole (PRILOSEC OTC) 20 MG tablet Take 20 mg by mouth daily before breakfast.      . cyclobenzaprine (FLEXERIL) 5 MG tablet Take 1 tablet (5 mg total) by mouth at bedtime. (Patient not taking: Reported on 10/17/2019) 90 tablet 1  . traMADol-acetaminophen (ULTRACET) 37.5-325 MG tablet Take 1 tablet by mouth every 8 (eight) hours as needed. (Patient not taking: Reported on 10/17/2019) 30 tablet 0   No facility-administered medications prior to visit.    Allergies  Allergen Reactions  . Feldene [Piroxicam] Anaphylaxis    ROS Review of Systems  Constitutional: Negative.   HENT: Negative.  Eyes: Negative.   Respiratory: Negative.   Cardiovascular: Negative.   Gastrointestinal: Negative.   Endocrine: Negative.   Genitourinary: Negative.   Musculoskeletal: Negative.   Skin: Negative.   Allergic/Immunologic: Negative.   Neurological: Negative.   Hematological: Negative.   Psychiatric/Behavioral: Negative.   All other systems reviewed and are negative.     Objective:    Physical Exam  Constitutional: He is oriented to person, place, and time. He appears well-developed and well-nourished. No distress.  HENT:  Head: Normocephalic and atraumatic.  Right Ear: External ear normal.  Left Ear: External ear normal.  Nose: Nose normal.  Mouth/Throat: Oropharynx is clear and moist. No oropharyngeal exudate.  Eyes: Pupils are equal, round, and reactive to light. Conjunctivae and EOM are normal.  Right eye exhibits no discharge. Left eye exhibits no discharge. No scleral icterus.  Neck: No JVD present. No tracheal deviation present. No thyromegaly present.  Cardiovascular: Normal rate, regular rhythm, normal heart sounds and intact distal pulses. Exam reveals no gallop and no friction rub.  No murmur heard. Pulmonary/Chest: Effort normal and breath sounds normal. No respiratory distress. He has no wheezes. He has no rales. He exhibits no tenderness.  Abdominal: Soft. Bowel sounds are normal. He exhibits no distension and no mass. There is no abdominal tenderness. There is no rebound and no guarding.  Musculoskeletal:        General: No tenderness, deformity or edema. Normal range of motion.     Cervical back: Normal range of motion and neck supple.  Lymphadenopathy:    He has no cervical adenopathy.  Neurological: He is alert and oriented to person, place, and time. No cranial nerve deficit. He exhibits normal muscle tone. Coordination normal.  Skin: Skin is warm and dry. No rash noted. He is not diaphoretic. No erythema. No pallor.  Psychiatric: He has a normal mood and affect. His behavior is normal. Judgment and thought content normal.  Nursing note and vitals reviewed.   BP 133/82   Pulse 71   Temp 97.8 F (36.6 C) (Temporal)   Ht 5' 9.5" (1.765 m)   Wt 196 lb 3.2 oz (89 kg)   SpO2 96%   BMI 28.56 kg/m  Wt Readings from Last 3 Encounters:  10/17/19 196 lb 3.2 oz (89 kg)  02/27/18 193 lb 9.6 oz (87.8 kg)  08/26/17 189 lb (85.7 kg)     There are no preventive care reminders to display for this patient.  There are no preventive care reminders to display for this patient.  Lab Results  Component Value Date   TSH 3.190 10/17/2019   Lab Results  Component Value Date   WBC 6.7 10/17/2019   HGB 14.2 10/17/2019   HCT 40.7 10/17/2019   MCV 93 10/17/2019   PLT 253 10/17/2019   Lab Results  Component Value Date   NA 136 10/17/2019   K 4.3 10/17/2019   CO2 19 (L)  10/17/2019   GLUCOSE 97 10/17/2019   BUN 12 10/17/2019   CREATININE 0.81 10/17/2019   BILITOT 0.3 10/17/2019   ALKPHOS 94 10/17/2019   AST 27 10/17/2019   ALT 43 10/17/2019   PROT 7.6 10/17/2019   ALBUMIN 5.0 (H) 10/17/2019   CALCIUM 9.9 10/17/2019   GFR 104.64 05/26/2011   Lab Results  Component Value Date   CHOL 239 (H) 10/17/2019   Lab Results  Component Value Date   HDL 37 (L) 10/17/2019   Lab Results  Component Value Date   LDLCALC 144 (H)  10/17/2019   Lab Results  Component Value Date   TRIG 319 (H) 10/17/2019   Lab Results  Component Value Date   CHOLHDL 6.5 (H) 10/17/2019   Lab Results  Component Value Date   HGBA1C 5.7 (H) 10/17/2019      Assessment & Plan:   Problem List Items Addressed This Visit    None    Visit Diagnoses    Need for prophylactic vaccination against Streptococcus pneumoniae (pneumococcus)    -  Primary   Relevant Orders   Pneumococcal conjugate vaccine 13-valent (Completed)   Need for diphtheria-tetanus-pertussis (Tdap) vaccine       Relevant Orders   Tdap vaccine greater than or equal to 7yo IM (Completed)   Screening for endocrine, metabolic and immunity disorder       Relevant Orders   Comprehensive metabolic panel (Completed)   CBC with Differential (Completed)   TSH (Completed)   Hemoglobin A1c (Completed)   Lipid screening       Relevant Orders   Lipid panel (Completed)   Encounter for screening for other viral diseases       Relevant Orders   Hepatitis C antibody (Completed)   HIV antibody (with reflex) (Completed)   Physical exam       Encounter to establish care          No orders of the defined types were placed in this encounter.   Follow-up: No follow-ups on file.   PLAN  Unremarkable exam  Vaccines administered  Labs collected, will follow up as warranted  Patient encouraged to call clinic with any questions, comments, or concerns.  [name], NP

## 2019-11-05 ENCOUNTER — Ambulatory Visit: Payer: 59

## 2020-02-26 ENCOUNTER — Other Ambulatory Visit: Payer: Self-pay

## 2020-02-26 ENCOUNTER — Encounter: Payer: Self-pay | Admitting: Registered Nurse

## 2020-02-26 ENCOUNTER — Ambulatory Visit: Payer: 59 | Admitting: Registered Nurse

## 2020-02-26 VITALS — BP 132/72 | HR 72 | Temp 98.1°F | Resp 18 | Ht 69.5 in | Wt 193.4 lb

## 2020-02-26 DIAGNOSIS — F411 Generalized anxiety disorder: Secondary | ICD-10-CM | POA: Diagnosis not present

## 2020-02-26 MED ORDER — ESCITALOPRAM OXALATE 10 MG PO TABS: 10.0000 mg | ORAL_TABLET | Freq: Every day | ORAL | 0 refills | Status: DC

## 2020-02-26 MED ORDER — ALPRAZOLAM 0.25 MG PO TBDP: 0.2500 mg | ORAL_TABLET | Freq: Every evening | ORAL | 0 refills | Status: DC | PRN

## 2020-02-26 MED ORDER — ESCITALOPRAM OXALATE 5 MG PO TABS: 5.0000 mg | ORAL_TABLET | Freq: Every day | ORAL | 0 refills | Status: DC

## 2020-02-26 NOTE — Patient Instructions (Signed)
° ° ° °  If you have lab work done today you will be contacted with your lab results within the next 2 weeks.  If you have not heard from us then please contact us. The fastest way to get your results is to register for My Chart. ° ° °IF you received an x-ray today, you will receive an invoice from [facility]. Please contact [facility] at [phone] with questions or concerns regarding your invoice.  ° °IF you received labwork today, you will receive an invoice from [facility]. Please contact [facility] at [phone] with questions or concerns regarding your invoice.  ° °Our billing staff will not be able to assist you with questions regarding bills from these companies. ° °You will be contacted with the lab results as soon as they are available. The fastest way to get your results is to activate your My Chart account. Instructions are located on the last page of this paperwork. If you have not heard from us regarding the results in 2 weeks, please contact this office. °  ° ° ° °

## 2020-02-29 ENCOUNTER — Other Ambulatory Visit: Payer: Self-pay | Admitting: Registered Nurse

## 2020-02-29 DIAGNOSIS — F411 Generalized anxiety disorder: Secondary | ICD-10-CM

## 2020-04-16 ENCOUNTER — Encounter: Payer: Self-pay | Admitting: Registered Nurse

## 2020-04-16 NOTE — Progress Notes (Signed)
Established Patient Office Visit  Subjective:  Patient ID: Adrian Ayers, male    DOB: January 28, 1961  Age: 59 y.o. MRN: [id number]  CC:  Chief Complaint  Patient presents with   Anxiety    GAD7=19 patient states he has been dealing with alot of anxiety over the past 3 months . 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  Family History  Problem Relation Age of Onset   Asthma Maternal Grandfather    Heart disease Maternal Grandfather    Lung cancer Father        smoked    Social History   Socioeconomic History   Marital status: Married    Spouse name: Not on file   Number of children: 3   Years of education: Not on file   Highest education level: Not on file  Occupational History   Occupation: heating and air business x 30 yrs  Tobacco Use   Smoking status: Former Smoker    Packs/day: 1.00    Years: 15.00    Pack years: 15.00    Types: Cigarettes    Quit date: 11/16/2005    Years since quitting: 14.4   Smokeless tobacco: Never Used  Vaping Use   Vaping Use: Never used  Substance and Sexual Activity   Alcohol use: Yes    Comment: occ   Drug use: No   Sexual activity: Not on file  Other Topics Concern   Not on file  Social History Narrative   Not on file   Social Determinants of Health   Financial Resource Strain:    Difficulty of Paying Living Expenses: Not on file  Food Insecurity:    Worried About Programme researcher, broadcasting/film/video in the Last Year: Not on file   [employer] of Food in the Last Year:  Not on file  Transportation Needs:    Lack of Transportation (Medical): Not on file   Lack of Transportation (Non-Medical): Not on file  Physical Activity:    Days of Exercise per Week: Not on file   Minutes of Exercise per Session: Not on file  Stress:    Feeling of Stress : Not on file  Social Connections:    Frequency of Communication with Friends and Family: Not on file   Frequency of Social Gatherings with Friends and Family: Not on file   Attends Religious Services: Not on file   Active Member of Clubs or Organizations: Not on file   Attends Banker Meetings: Not on file   Marital Status: Not on file  Intimate Partner Violence:    Fear of Current or Ex-Partner: Not on file   Emotionally Abused: Not on file   Physically Abused: Not on file   Sexually Abused: Not on file    Outpatient Medications Prior to Visit  Medication Sig Dispense Refill   atorvastatin (LIPITOR) 40 MG tablet Take 1 tablet (40 mg total) by mouth daily. 90 tablet 3   omeprazole (PRILOSEC OTC) 20 MG tablet Take 20 mg by  mouth daily before breakfast.       cyclobenzaprine (FLEXERIL) 5 MG tablet Take 1 tablet (5 mg total) by mouth at bedtime. (Patient not taking: Reported on 10/17/2019) 90 tablet 1   traMADol-acetaminophen (ULTRACET) 37.5-325 MG tablet Take 1 tablet by mouth every 8 (eight) hours as needed. (Patient not taking: Reported on 10/17/2019) 30 tablet 0   No facility-administered medications prior to visit.    Allergies  Allergen Reactions   Feldene [Piroxicam] Anaphylaxis    ROS Review of Systems  Constitutional: Negative.   HENT: Negative.   Eyes: Negative.   Respiratory: Negative.   Cardiovascular: Negative.   Gastrointestinal: Negative.   Genitourinary: Negative.   Musculoskeletal: Negative.   Skin: Negative.   Neurological: Negative.   Psychiatric/Behavioral: Negative.       Objective:    Physical Exam Constitutional:      General: He is not  in acute distress.    Appearance: Normal appearance. He is normal weight. He is not ill-appearing, toxic-appearing or diaphoretic.  Cardiovascular:     Rate and Rhythm: Normal rate and regular rhythm.     Heart sounds: Normal heart sounds. No murmur heard.  No friction rub. No gallop.   Pulmonary:     Effort: Pulmonary effort is normal. No respiratory distress.     Breath sounds: Normal breath sounds. No stridor. No wheezing, rhonchi or rales.  Chest:     Chest wall: No tenderness.  Neurological:     General: No focal deficit present.     Mental Status: He is alert and oriented to person, place, and time. Mental status is at baseline.  Psychiatric:        Mood and Affect: Mood normal.        Behavior: Behavior normal.        Thought Content: Thought content normal.        Judgment: Judgment normal.     BP 132/72    Pulse 72    Temp 98.1 F (36.7 C) (Temporal)    Resp 18    Ht 5' 9.5" (1.765 m)    Wt 193 lb 6.4 oz (87.7 kg)    SpO2 96%    BMI 28.15 kg/m  Wt Readings from Last 3 Encounters:  02/26/20 193 lb 6.4 oz (87.7 kg)  10/17/19 196 lb 3.2 oz (89 kg)  02/27/18 193 lb 9.6 oz (87.8 kg)     Health Maintenance Due  Topic Date Due   COVID-19 Vaccine (1) Never done   INFLUENZA VACCINE  Never done    There are no preventive care reminders to display for this patient.  Lab Results  Component Value Date   TSH 3.190 10/17/2019   Lab Results  Component Value Date   WBC 6.7 10/17/2019   HGB 14.2 10/17/2019   HCT 40.7 10/17/2019   MCV 93 10/17/2019   PLT 253 10/17/2019   Lab Results  Component Value Date   NA 136 10/17/2019   K 4.3 10/17/2019   CO2 19 (L) 10/17/2019   GLUCOSE 97 10/17/2019   BUN 12 10/17/2019   CREATININE 0.81 10/17/2019   BILITOT 0.3 10/17/2019   ALKPHOS 94 10/17/2019   AST 27 10/17/2019   ALT 43 10/17/2019   PROT 7.6 10/17/2019   ALBUMIN 5.0 (H) 10/17/2019   CALCIUM 9.9 10/17/2019   GFR 104.64 05/26/2011   Lab Results  Component Value  Date   CHOL 239 (H) 10/17/2019   Lab Results  Component Value Date   HDL  37 (L) 10/17/2019   Lab Results  Component Value Date   LDLCALC 144 (H) 10/17/2019   Lab Results  Component Value Date   TRIG 319 (H) 10/17/2019   Lab Results  Component Value Date   CHOLHDL 6.5 (H) 10/17/2019   Lab Results  Component Value Date   HGBA1C 5.7 (H) 10/17/2019      Assessment & Plan:   Problem List Items Addressed This Visit    None    Visit Diagnoses    GAD (generalized anxiety disorder)    -  Primary   Relevant Medications   escitalopram (LEXAPRO) 10 MG tablet   ALPRAZolam (NIRAVAM) 0.25 MG dissolvable tablet      Meds ordered this encounter  Medications   DISCONTD: escitalopram (LEXAPRO) 5 MG tablet    Sig: Take 1 tablet (5 mg total) by mouth daily.    Dispense:  7 tablet    Refill:  0    Order Specific Question:   Supervising Provider    Answer:   Neva Seat, Aayden R [[id number]]   escitalopram (LEXAPRO) 10 MG tablet    Sig: Take 1 tablet (10 mg total) by mouth daily.    Dispense:  90 tablet    Refill:  0    Order Specific Question:   Supervising Provider    Answer:   Neva Seat, Uzair R [[id number]]   ALPRAZolam (NIRAVAM) 0.25 MG dissolvable tablet    Sig: Take 1 tablet (0.25 mg total) by mouth at bedtime as needed for anxiety.    Dispense:  30 tablet    Refill:  0    Order Specific Question:   Supervising Provider    Answer:   Neva Seat, Lambros R [[id number]]    Follow-up: No follow-ups on file.   PLAN  lexapro 5mg  PO qhs for one week then increase to 10mg  PO qhs  Alprazolam 0.25mg  sublingual po qd prn for breakthrough anxiety  Return in 4-6 weeks for med check  Patient encouraged to call clinic with any questions, comments, or concerns.  , NP

## 2020-04-29 ENCOUNTER — Encounter: Payer: Self-pay | Admitting: Registered Nurse

## 2020-04-29 ENCOUNTER — Ambulatory Visit: Payer: 59 | Admitting: Registered Nurse

## 2020-04-29 ENCOUNTER — Other Ambulatory Visit: Payer: Self-pay

## 2020-04-29 VITALS — BP 111/72 | HR 71 | Temp 97.9°F | Ht 69.0 in | Wt 188.2 lb

## 2020-04-29 DIAGNOSIS — F988 Other specified behavioral and emotional disorders with onset usually occurring in childhood and adolescence: Secondary | ICD-10-CM | POA: Diagnosis not present

## 2020-04-29 DIAGNOSIS — F411 Generalized anxiety disorder: Secondary | ICD-10-CM

## 2020-04-29 NOTE — Patient Instructions (Signed)
° ° ° °  If you have lab work done today you will be contacted with your lab results within the next 2 weeks.  If you have not heard from us then please contact us. The fastest way to get your results is to register for My Chart. ° ° °IF you received an x-ray today, you will receive an invoice from [facility]. Please contact [facility] at [phone] with questions or concerns regarding your invoice.  ° °IF you received labwork today, you will receive an invoice from [facility]. Please contact [facility] at [phone] with questions or concerns regarding your invoice.  ° °Our billing staff will not be able to assist you with questions regarding bills from these companies. ° °You will be contacted with the lab results as soon as they are available. The fastest way to get your results is to activate your My Chart account. Instructions are located on the last page of this paperwork. If you have not heard from us regarding the results in 2 weeks, please contact this office. °  ° ° ° °

## 2020-04-30 ENCOUNTER — Telehealth: Payer: Self-pay | Admitting: Registered Nurse

## 2020-04-30 MED ORDER — ALPRAZOLAM 0.5 MG PO TBDP: 0.5000 mg | ORAL_TABLET | Freq: Every evening | ORAL | 0 refills | Status: DC | PRN

## 2020-04-30 MED ORDER — AMPHETAMINE-DEXTROAMPHETAMINE 10 MG PO TABS: 10.0000 mg | ORAL_TABLET | Freq: Two times a day (BID) | ORAL | 0 refills | Status: DC

## 2020-04-30 MED ORDER — BUSPIRONE HCL 10 MG PO TABS: 10.0000 mg | ORAL_TABLET | Freq: Three times a day (TID) | ORAL | 0 refills | Status: DC

## 2020-05-13 ENCOUNTER — Ambulatory Visit: Payer: Self-pay | Admitting: Registered Nurse

## 2020-05-13 NOTE — Telephone Encounter (Signed)
Pt reports headache, nausea, mild dizziness, blurred vision after taking Buspar. Onset 5-7 days after starting med. Pt has not taken for 3 days, no symptoms presently. States headache "Entire head, at eyes some." Reports took Buspar 3 times a day, "And noticed symptoms would start after taking.States "Nothing serious, just no working for me."  " States was advised to call if any problems with med. "I don't think it's working for me and wanted him to know."  Assured NT would route to practice for PCPs review. CAre advise given, ED for worsening, reoccurring symptoms. Pt verbalizes understanding.  CAll was dropped after triage completed,  pt said he had a question regarding "Address" Attempted to CB, busy signal.  Please advise: [phone]  Reason for Disposition  [1] MILD-MODERATE headache AND [2] present > 72 hours  Answer Assessment - Initial Assessment Questions 1. LOCATION: "Where does it hurt?"      Around eyes, entire head 2. ONSET: "When did the headache start?" (Minutes, hours or days)      *After taking buspar 3. PATTERN: "Does the pain come and go, or has it been constant since it started?"     "At first came and went, then every time I took the Buspar."   4. SEVERITY: "How bad is the pain?" and "What does it keep you from doing?"  (e.g., Scale 1-10; mild, moderate, or severe)   - MILD (1-3): doesn't interfere with normal activities    - MODERATE (4-7): interferes with normal activities or awakens from sleep    - SEVERE (8-10): excruciating pain, unable to do any normal activities        6/10 5. RECURRENT SYMPTOM: "Have you ever had headaches before?" If Yes, ask: "When was the last time?" and "What happened that time?"      No 6. CAUSE: "What do you think is causing the headache?"     Buspar 7. MIGRAINE: "Have you been diagnosed with migraine headaches?" If Yes, ask: "Is this headache similar?"      no 8. HEAD INJURY: "Has there been any recent injury to the head?"       no 9. OTHER SYMPTOMS: "Do you have any other symptoms?" (fever, stiff neck, eye pain, sore throat, cold symptoms)     Mild dizziness, blurred vision, nausea, fatigue. None presently.  Protocols used: HEADACHE-A-AH

## 2020-05-13 NOTE — Telephone Encounter (Signed)
Pt took buspar as prescribed pt reports side effects such as headache nausea pt requesting alternative or other advice

## 2020-05-15 ENCOUNTER — Other Ambulatory Visit: Payer: Self-pay | Admitting: Registered Nurse

## 2020-05-15 DIAGNOSIS — F411 Generalized anxiety disorder: Secondary | ICD-10-CM

## 2020-05-15 MED ORDER — HYDROXYZINE HCL 50 MG PO TABS: 50.0000 mg | ORAL_TABLET | Freq: Three times a day (TID) | ORAL | 0 refills | Status: DC | PRN

## 2020-05-22 ENCOUNTER — Other Ambulatory Visit: Payer: Self-pay | Admitting: Registered Nurse

## 2020-05-22 DIAGNOSIS — F411 Generalized anxiety disorder: Secondary | ICD-10-CM

## 2020-05-23 ENCOUNTER — Other Ambulatory Visit: Payer: Self-pay | Admitting: Registered Nurse

## 2020-05-23 DIAGNOSIS — F411 Generalized anxiety disorder: Secondary | ICD-10-CM

## 2020-05-23 MED ORDER — CLONAZEPAM 1 MG PO TABS: 1.0000 mg | ORAL_TABLET | Freq: Two times a day (BID) | ORAL | 1 refills | Status: DC | PRN

## 2020-05-27 ENCOUNTER — Encounter: Payer: Self-pay | Admitting: Registered Nurse

## 2020-05-27 ENCOUNTER — Ambulatory Visit: Payer: 59 | Admitting: Registered Nurse

## 2020-05-27 ENCOUNTER — Other Ambulatory Visit: Payer: Self-pay

## 2020-05-27 DIAGNOSIS — F411 Generalized anxiety disorder: Secondary | ICD-10-CM

## 2020-05-27 MED ORDER — FLUOXETINE HCL 10 MG PO CAPS: 10.0000 mg | ORAL_CAPSULE | Freq: Every day | ORAL | 3 refills | Status: DC

## 2020-05-27 MED ORDER — CLONAZEPAM 1 MG PO TABS: 1.0000 mg | ORAL_TABLET | Freq: Two times a day (BID) | ORAL | 1 refills | Status: DC | PRN

## 2020-05-27 NOTE — Patient Instructions (Signed)
° ° ° °  If you have lab work done today you will be contacted with your lab results within the next 2 weeks.  If you have not heard from us then please contact us. The fastest way to get your results is to register for My Chart. ° ° °IF you received an x-ray today, you will receive an invoice from [facility]. Please contact [facility] at [phone] with questions or concerns regarding your invoice.  ° °IF you received labwork today, you will receive an invoice from [facility]. Please contact [facility] at [phone] with questions or concerns regarding your invoice.  ° °Our billing staff will not be able to assist you with questions regarding bills from these companies. ° °You will be contacted with the lab results as soon as they are available. The fastest way to get your results is to activate your My Chart account. Instructions are located on the last page of this paperwork. If you have not heard from us regarding the results in 2 weeks, please contact this office. °  ° ° ° °

## 2020-05-27 NOTE — Progress Notes (Signed)
Established Patient Office Visit  Subjective:  Patient ID: Adrian Ayers, male    DOB: [birth year]  Age: 59 y.o. MRN: [id number]  CC:  Chief Complaint  Patient presents with   Medication Management    Patient states he is here for medication management. Patient would like to discuss medications.    HPI Adrian Ayers presents for ongoing management of anxiety.   Clonazepam - has been available to him for 3 days. Has been effective. Still feels a little shakey at times but reports that he has more clarity.  Interested in trying another SSRI. We spent substantial time discussing benzodiazepines vs. SSRIs - risks, benefits, and side effects of each.   Past Medical History:  Diagnosis Date   GERD (gastroesophageal reflux disease)    Multiple allergies     No past surgical history on file.  Family History  Problem Relation Age of Onset   Asthma Maternal Grandfather    Heart disease Maternal Grandfather    Lung cancer Father        smoked    Social History   Socioeconomic History   Marital status: Married    Spouse name: Not on file   Number of children: 3   Years of education: Not on file   Highest education level: Not on file  Occupational History   Occupation: heating and air business x 30 yrs  Tobacco Use   Smoking status: Former Smoker    Packs/day: 1.00    Years: 15.00    Pack years: 15.00    Types: Cigarettes    Quit date: 11/16/2005    Years since quitting: 14.5   Smokeless tobacco: Never Used  Vaping Use   Vaping Use: Never used  Substance and Sexual Activity   Alcohol use: Yes    Comment: occ   Drug use: No   Sexual activity: Yes  Other Topics Concern   Not on file  Social History Narrative   Not on file   Social Determinants of Health   Financial Resource Strain:    Difficulty of Paying Living Expenses: Not on file  Food Insecurity:    Worried About Programme researcher, broadcasting/film/video in the Last Year: Not on file   [employer] of Food  in the Last Year: Not on file  Transportation Needs:    Lack of Transportation (Medical): Not on file   Lack of Transportation (Non-Medical): Not on file  Physical Activity:    Days of Exercise per Week: Not on file   Minutes of Exercise per Session: Not on file  Stress:    Feeling of Stress : Not on file  Social Connections:    Frequency of Communication with Friends and Family: Not on file   Frequency of Social Gatherings with Friends and Family: Not on file   Attends Religious Services: Not on file   Active Member of Clubs or Organizations: Not on file   Attends Banker Meetings: Not on file   Marital Status: Not on file  Intimate Partner Violence:    Fear of Current or Ex-Partner: Not on file   Emotionally Abused: Not on file   Physically Abused: Not on file   Sexually Abused: Not on file    Outpatient Medications Prior to Visit  Medication Sig Dispense Refill   ALPRAZolam (NIRAVAM) 0.5 MG dissolvable tablet Take 1 tablet (0.5 mg total) by mouth at bedtime as needed for anxiety. 30 tablet 0   atorvastatin (LIPITOR) 40 MG tablet  Take 1 tablet (40 mg total) by mouth daily. 90 tablet 3   omeprazole (PRILOSEC OTC) 20 MG tablet Take 20 mg by mouth daily before breakfast.       clonazePAM (KLONOPIN) 1 MG tablet Take 1 tablet (1 mg total) by mouth 2 (two) times daily as needed for anxiety. 20 tablet 1   amphetamine-dextroamphetamine (ADDERALL) 10 MG tablet Take 1 tablet (10 mg total) by mouth 2 (two) times daily. (Patient not taking: Reported on 05/27/2020) 60 tablet 0   busPIRone (BUSPAR) 10 MG tablet Take 1 tablet (10 mg total) by mouth 3 (three) times daily. (Patient not taking: Reported on 05/27/2020) 90 tablet 0   cyclobenzaprine (FLEXERIL) 5 MG tablet Take 1 tablet (5 mg total) by mouth at bedtime. (Patient not taking: Reported on 05/27/2020) 90 tablet 1   hydrOXYzine (ATARAX/VISTARIL) 50 MG tablet Take 1 tablet (50 mg total) by mouth 3 (three)  times daily as needed. (Patient not taking: Reported on 05/27/2020) 90 tablet 0   traMADol-acetaminophen (ULTRACET) 37.5-325 MG tablet Take 1 tablet by mouth every 8 (eight) hours as needed. (Patient not taking: Reported on 05/27/2020) 30 tablet 0   No facility-administered medications prior to visit.    Allergies  Allergen Reactions   Feldene [Piroxicam] Anaphylaxis    ROS Review of Systems  Constitutional: Negative.   HENT: Negative.   Eyes: Negative.   Respiratory: Negative.   Cardiovascular: Negative.   Gastrointestinal: Negative.   Genitourinary: Negative.   Musculoskeletal: Negative.   Skin: Negative.   Neurological: Negative.   Psychiatric/Behavioral: Positive for sleep disturbance. The patient is nervous/anxious.       Objective:    Physical Exam Constitutional:      General: He is not in acute distress.    Appearance: Normal appearance. He is normal weight. He is not ill-appearing, toxic-appearing or diaphoretic.  Cardiovascular:     Rate and Rhythm: Normal rate and regular rhythm.  Pulmonary:     Effort: Pulmonary effort is normal. No respiratory distress.  Neurological:     General: No focal deficit present.     Mental Status: He is alert and oriented to person, place, and time. Mental status is at baseline.  Psychiatric:        Mood and Affect: Mood normal.        Behavior: Behavior normal.        Thought Content: Thought content normal.        Judgment: Judgment normal.     BP 136/74    Pulse 72    Temp 98.4 F (36.9 C) (Temporal)    Resp 18    Ht 5\' 9"  (1.753 m)    Wt 186 lb 6.4 oz (84.6 kg)    SpO2 96%    BMI 27.53 kg/m  Wt Readings from Last 3 Encounters:  05/27/20 186 lb 6.4 oz (84.6 kg)  04/29/20 188 lb 3.2 oz (85.4 kg)  02/26/20 193 lb 6.4 oz (87.7 kg)     There are no preventive care reminders to display for this patient.  There are no preventive care reminders to display for this patient.  Lab Results  Component Value Date   TSH  3.190 10/17/2019   Lab Results  Component Value Date   WBC 6.7 10/17/2019   HGB 14.2 10/17/2019   HCT 40.7 10/17/2019   MCV 93 10/17/2019   PLT 253 10/17/2019   Lab Results  Component Value Date   NA 136 10/17/2019   K 4.3 10/17/2019  CO2 19 (L) 10/17/2019   GLUCOSE 97 10/17/2019   BUN 12 10/17/2019   CREATININE 0.81 10/17/2019   BILITOT 0.3 10/17/2019   ALKPHOS 94 10/17/2019   AST 27 10/17/2019   ALT 43 10/17/2019   PROT 7.6 10/17/2019   ALBUMIN 5.0 (H) 10/17/2019   CALCIUM 9.9 10/17/2019   GFR 104.64 05/26/2011   Lab Results  Component Value Date   CHOL 239 (H) 10/17/2019   Lab Results  Component Value Date   HDL 37 (L) 10/17/2019   Lab Results  Component Value Date   LDLCALC 144 (H) 10/17/2019   Lab Results  Component Value Date   TRIG 319 (H) 10/17/2019   Lab Results  Component Value Date   CHOLHDL 6.5 (H) 10/17/2019   Lab Results  Component Value Date   HGBA1C 5.7 (H) 10/17/2019      Assessment & Plan:   Problem List Items Addressed This Visit    None    Visit Diagnoses    GAD (generalized anxiety disorder)       Relevant Medications   FLUoxetine (PROZAC) 10 MG capsule   clonazePAM (KLONOPIN) 1 MG tablet      Meds ordered this encounter  Medications   FLUoxetine (PROZAC) 10 MG capsule    Sig: Take 1 capsule (10 mg total) by mouth daily.    Dispense:  90 capsule    Refill:  3    Order Specific Question:   Supervising Provider    Answer:   Neva Seat, Joash R [[id number]]   clonazePAM (KLONOPIN) 1 MG tablet    Sig: Take 1 tablet (1 mg total) by mouth 2 (two) times daily as needed for anxiety.    Dispense:  20 tablet    Refill:  1    Order Specific Question:   Supervising Provider    Answer:   Neva Seat, Samad R [[id number]]    Follow-up: No follow-ups on file.   PLAN  Continue clonazepam 0.5mg  PO bid PRN  Start fluoxetine (Prozac) 10mg  PO qd  Med check via MyChart message in around 2 weeks  Patient encouraged to call clinic with any  questions, comments, or concerns.  I spent 47 minutes with this patient, more than 50% of which was spent counseling and/or educating.  , NP

## 2020-06-11 ENCOUNTER — Encounter: Payer: Self-pay | Admitting: Registered Nurse

## 2020-06-11 NOTE — Telephone Encounter (Signed)
Pt agrees to therapy suggested

## 2020-06-29 ENCOUNTER — Encounter: Payer: Self-pay | Admitting: Registered Nurse

## 2020-06-29 NOTE — Progress Notes (Signed)
Established Patient Office Visit  Subjective:  Patient ID: Adrian Ayers, male    DOB: Aug 17, 1960  Age: 59 y.o. MRN: [id number]  CC:  Chief Complaint  Patient presents with   Follow-up    GAD 20 Pt stated that the medication is irritating his stomach.    HPI Adrian Ayers presents for anxiety follow up  Started on lexapro 10mg  PO qhs. States having too much GI upset - cannot tolerate - has stopped. Cautious of SSRI class due to this. Would prefer to try something else. Most symptoms are feeling overwhelmed leading to quick anger, frustration, and arguments at home. No new symptoms. Continues to deny hi/si  Past Medical History:  Diagnosis Date   GERD (gastroesophageal reflux disease)    Multiple allergies     History reviewed. No pertinent surgical history.  Family History  Problem Relation Age of Onset   Asthma Maternal Grandfather    Heart disease Maternal Grandfather    Lung cancer Father        smoked    Social History   Socioeconomic History   Marital status: Married    Spouse name: Not on file   Number of children: 3   Years of education: Not on file   Highest education level: Not on file  Occupational History   Occupation: heating and air business x 30 yrs  Tobacco Use   Smoking status: Former Smoker    Packs/day: 1.00    Years: 15.00    Pack years: 15.00    Types: Cigarettes    Quit date: 11/16/2005    Years since quitting: 14.6   Smokeless tobacco: Never Used  Vaping Use   Vaping Use: Never used  Substance and Sexual Activity   Alcohol use: Yes    Comment: occ   Drug use: No   Sexual activity: Yes  Other Topics Concern   Not on file  Social History Narrative   Not on file   Social Determinants of Health   Financial Resource Strain: Not on file  Food Insecurity: Not on file  Transportation Needs: Not on file  Physical Activity: Not on file  Stress: Not on file  Social Connections: Not on file  Intimate Partner  Violence: Not on file    Outpatient Medications Prior to Visit  Medication Sig Dispense Refill   atorvastatin (LIPITOR) 40 MG tablet Take 1 tablet (40 mg total) by mouth daily. 90 tablet 3   omeprazole (PRILOSEC OTC) 20 MG tablet Take 20 mg by mouth daily before breakfast.       ALPRAZolam (NIRAVAM) 0.25 MG dissolvable tablet Take 1 tablet (0.25 mg total) by mouth at bedtime as needed for anxiety. 30 tablet 0   cyclobenzaprine (FLEXERIL) 5 MG tablet Take 1 tablet (5 mg total) by mouth at bedtime. (Patient not taking: Reported on 05/27/2020) 90 tablet 1   escitalopram (LEXAPRO) 10 MG tablet Take 1 tablet (10 mg total) by mouth daily. 90 tablet 0   escitalopram (LEXAPRO) 5 MG tablet TAKE ONE TABLET BY MOUTH DAILY 7 tablet 0   traMADol-acetaminophen (ULTRACET) 37.5-325 MG tablet Take 1 tablet by mouth every 8 (eight) hours as needed. (Patient not taking: Reported on 05/27/2020) 30 tablet 0   No facility-administered medications prior to visit.    Allergies  Allergen Reactions   Feldene [Piroxicam] Anaphylaxis    ROS Review of Systems Per hpi    Objective:    Physical Exam Constitutional:      General: He is  not in acute distress.    Appearance: Normal appearance. He is normal weight. He is not ill-appearing, toxic-appearing or diaphoretic.  Cardiovascular:     Rate and Rhythm: Normal rate and regular rhythm.     Heart sounds: Normal heart sounds. No murmur heard. No friction rub. No gallop.   Pulmonary:     Effort: Pulmonary effort is normal. No respiratory distress.     Breath sounds: Normal breath sounds. No stridor. No wheezing, rhonchi or rales.  Chest:     Chest wall: No tenderness.  Neurological:     General: No focal deficit present.     Mental Status: He is alert and oriented to person, place, and time. Mental status is at baseline.  Psychiatric:        Mood and Affect: Mood normal.        Behavior: Behavior normal.        Thought Content: Thought content  normal.        Judgment: Judgment normal.     BP 111/72 (BP Location: Right Arm, Patient Position: Sitting, Cuff Size: Normal)    Pulse 71    Temp 97.9 F (36.6 C) (Temporal)    Ht 5\' 9"  (1.753 m)    Wt 188 lb 3.2 oz (85.4 kg)    SpO2 95%    BMI 27.79 kg/m  Wt Readings from Last 3 Encounters:  05/27/20 186 lb 6.4 oz (84.6 kg)  04/29/20 188 lb 3.2 oz (85.4 kg)  02/26/20 193 lb 6.4 oz (87.7 kg)     Health Maintenance Due  Topic Date Due   COVID-19 Vaccine (2 - Booster for Janssen series) 01/30/2020    There are no preventive care reminders to display for this patient.  Lab Results  Component Value Date   TSH 3.190 10/17/2019   Lab Results  Component Value Date   WBC 6.7 10/17/2019   HGB 14.2 10/17/2019   HCT 40.7 10/17/2019   MCV 93 10/17/2019   PLT 253 10/17/2019   Lab Results  Component Value Date   NA 136 10/17/2019   K 4.3 10/17/2019   CO2 19 (L) 10/17/2019   GLUCOSE 97 10/17/2019   BUN 12 10/17/2019   CREATININE 0.81 10/17/2019   BILITOT 0.3 10/17/2019   ALKPHOS 94 10/17/2019   AST 27 10/17/2019   ALT 43 10/17/2019   PROT 7.6 10/17/2019   ALBUMIN 5.0 (H) 10/17/2019   CALCIUM 9.9 10/17/2019   GFR 104.64 05/26/2011   Lab Results  Component Value Date   CHOL 239 (H) 10/17/2019   Lab Results  Component Value Date   HDL 37 (L) 10/17/2019   Lab Results  Component Value Date   LDLCALC 144 (H) 10/17/2019   Lab Results  Component Value Date   TRIG 319 (H) 10/17/2019   Lab Results  Component Value Date   CHOLHDL 6.5 (H) 10/17/2019   Lab Results  Component Value Date   HGBA1C 5.7 (H) 10/17/2019      Assessment & Plan:   Problem List Items Addressed This Visit   None   Visit Diagnoses    GAD (generalized anxiety disorder)    -  Primary   Relevant Medications   ALPRAZolam (NIRAVAM) 0.5 MG dissolvable tablet   Attention deficit disorder, unspecified hyperactivity presence          Meds ordered this encounter  Medications   DISCONTD:  busPIRone (BUSPAR) 10 MG tablet    Sig: Take 1 tablet (10 mg total) by mouth 3 (  three) times daily.    Dispense:  90 tablet    Refill:  0    Order Specific Question:   Supervising Provider    Answer:   Neva Seat, Kendric R [[id number]]   DISCONTD: amphetamine-dextroamphetamine (ADDERALL) 10 MG tablet    Sig: Take 1 tablet (10 mg total) by mouth 2 (two) times daily.    Dispense:  60 tablet    Refill:  0    Order Specific Question:   Supervising Provider    Answer:   Neva Seat, Daxter R [[id number]]   ALPRAZolam (NIRAVAM) 0.5 MG dissolvable tablet    Sig: Take 1 tablet (0.5 mg total) by mouth at bedtime as needed for anxiety.    Dispense:  30 tablet    Refill:  0    Order Specific Question:   Supervising Provider    Answer:   Neva Seat, Myshawn R [[id number]]    Follow-up: No follow-ups on file.   PLAN  Will try some prn treaments - alprazolam and buspar  Concern for some adult add/adhd? Will try short course of low dose adderall.  Pt in agreement with plan after discussing r/se/b of plan  Return within 1 mo for med check  Patient encouraged to call clinic with any questions, comments, or concerns.  [name], NP

## 2020-07-10 ENCOUNTER — Other Ambulatory Visit: Payer: Self-pay | Admitting: Registered Nurse

## 2020-07-10 DIAGNOSIS — F411 Generalized anxiety disorder: Secondary | ICD-10-CM

## 2020-07-10 MED ORDER — CLONAZEPAM 1 MG PO TABS: 1.0000 mg | ORAL_TABLET | Freq: Two times a day (BID) | ORAL | 1 refills | Status: DC | PRN

## 2020-07-10 MED ORDER — FLUOXETINE HCL 20 MG PO CAPS: 20.0000 mg | ORAL_CAPSULE | Freq: Every day | ORAL | 0 refills | Status: DC

## 2020-10-01 ENCOUNTER — Ambulatory Visit: Payer: 59 | Admitting: Registered Nurse

## 2020-10-01 ENCOUNTER — Other Ambulatory Visit: Payer: Self-pay

## 2020-10-01 ENCOUNTER — Encounter: Payer: Self-pay | Admitting: Registered Nurse

## 2020-10-01 VITALS — BP 132/80 | HR 69 | Temp 98.0°F | Resp 18 | Ht 69.0 in | Wt 188.2 lb

## 2020-10-01 DIAGNOSIS — M79642 Pain in left hand: Secondary | ICD-10-CM

## 2020-10-01 DIAGNOSIS — M792 Neuralgia and neuritis, unspecified: Secondary | ICD-10-CM | POA: Diagnosis not present

## 2020-10-01 DIAGNOSIS — R062 Wheezing: Secondary | ICD-10-CM | POA: Diagnosis not present

## 2020-10-01 DIAGNOSIS — F411 Generalized anxiety disorder: Secondary | ICD-10-CM | POA: Diagnosis not present

## 2020-10-01 DIAGNOSIS — M79641 Pain in right hand: Secondary | ICD-10-CM

## 2020-10-01 DIAGNOSIS — R6882 Decreased libido: Secondary | ICD-10-CM

## 2020-10-01 MED ORDER — PREDNISONE 10 MG (21) PO TBPK: ORAL_TABLET | ORAL | 0 refills | Status: DC

## 2020-10-01 MED ORDER — CLONAZEPAM 2 MG PO TABS: 1.0000 mg | ORAL_TABLET | Freq: Two times a day (BID) | ORAL | 0 refills | Status: DC

## 2020-10-01 MED ORDER — ALBUTEROL SULFATE HFA 108 (90 BASE) MCG/ACT IN AERS: 2.0000 | INHALATION_SPRAY | Freq: Four times a day (QID) | RESPIRATORY_TRACT | 0 refills | Status: DC | PRN

## 2020-10-01 MED ORDER — SILDENAFIL CITRATE 50 MG PO TABS: 25.0000 mg | ORAL_TABLET | Freq: Every day | ORAL | 0 refills | Status: DC | PRN

## 2020-10-01 MED ORDER — FLUOXETINE HCL 40 MG PO CAPS: 40.0000 mg | ORAL_CAPSULE | Freq: Every day | ORAL | 3 refills | Status: DC

## 2020-10-01 NOTE — Patient Instructions (Addendum)
Adrian Ayers -   [name] to see you today. Glad things have gotten better, sorry we're not where you want to be just yet. Unfortunately, these things do tend to take time. Fortunately, I think increasing the fluoxetine may be just the piece we need to do this. An increase in the clonazepam in the mean time can help bridge that gap. I do think continuing to consider counseling would be beneficial - we can even look into couples counseling, not that I'm as worried about your wife, as she's not the patient I'm treating right now, but because it can help to open up that conversation with a therapist.  Side effects that you're experiencing from the fluoxetine are normal - especially in the first few months on an SSRI, a decreased sex drive is not uncommon. In fact, data seems to range from 10-50% for this side effect and other sexual dysfunction including erectile dysfunction, delayed ejaculation or anorgasmia. In my prescribing experience, it seems closer to the 10% than 50%, but of course the data doesn't matter as much as your experience.   In the end, though, we can see fluoxetine, clonazepam, stress/anxiety/depression, low testosterone, benign prostate enlargement, aging, and many more factors affect libido. I'd rather have your mental health in check and deal with low libido than have both anxiety and low libido.   Let me know how the next few weeks go. Increase in fluoxetine may mean mild upset stomach for the first few days, but this should resolve quickly. Otherwise, I don't anticipate anything but further control of symptoms.   Thanks,  Rich    If you have lab work done today you will be contacted with your lab results within the next 2 weeks.  If you have not heard from [place] then please contact us. The fastest way to get your results is to register for My Chart.   IF you received an x-ray today, you will receive an invoice from [facility]. Please contact [facility] at  [phone] with questions or concerns regarding your invoice.   IF you received labwork today, you will receive an invoice from [facility]. Please contact [facility] at [phone] with questions or concerns regarding your invoice.   Our billing staff will not be able to assist you with questions regarding bills from these companies.  You will be contacted with the lab results as soon as they are available. The fastest way to get your results is to activate your My Chart account. Instructions are located on the last page of this paperwork. If you have not heard from [place] regarding the results in 2 weeks, please contact this office.

## 2020-10-01 NOTE — Progress Notes (Signed)
Established Patient Office Visit  Subjective:  Patient ID: Adrian Ayers, male    DOB: 04-Nov-1960  Age: 60 y.o. MRN: [id number]  CC:  Chief Complaint  Patient presents with  . Follow-up    Patient states he is here for a follow up. Patient is starting to having some numbness in 2 fingers in his left hand.    HPI Adrian Ayers presents for med check and hand numbness  Med check Clonazepam and fluoxetine Has improved, but feels he is not yet where he wants to be  Still easy to anger and concerned about his marriage Does better with anxiety but wants to make further changes Reports clonazepam at 0.5mg  not effective, does not notice a change. Denies hi/si Concerned for decreased libido - no ED, less interest.  Hand numbness Ongoing for a week or so, mostly last two digits  No weakness Numbness seems to extend up arm Pain in medial epicondyle at times, particularly with some motions Has ongoing pain through shoulder and back. Notices more at night.  Works physical job, has suspected he has some arthritis. Has not tried medication.  Past Medical History:  Diagnosis Date  . GERD (gastroesophageal reflux disease)   . Multiple allergies     No past surgical history on file.  Family History  Problem Relation Age of Onset  . Asthma Maternal Grandfather   . Heart disease Maternal Grandfather   . Lung cancer Father        smoked    Social History   Socioeconomic History  . Marital status: Married    Spouse name: Not on file  . Number of children: 3  . Years of education: Not on file  . Highest education level: Not on file  Occupational History  . Occupation: heating and air business x 30 yrs  Tobacco Use  . Smoking status: Former Smoker    Packs/day: 1.00    Years: 15.00    Pack years: 15.00    Types: Cigarettes    Quit date: 11/16/2005    Years since quitting: 14.8  . Smokeless tobacco: Never Used  Vaping Use  . Vaping Use: Never used  Substance and  Sexual Activity  . Alcohol use: Yes    Comment: occ  . Drug use: No  . Sexual activity: Yes  Other Topics Concern  . Not on file  Social History Narrative  . Not on file   Social Determinants of Health   Financial Resource Strain: Not on file  Food Insecurity: Not on file  Transportation Needs: Not on file  Physical Activity: Not on file  Stress: Not on file  Social Connections: Not on file  Intimate Partner Violence: Not on file    Outpatient Medications Prior to Visit  Medication Sig Dispense Refill  . atorvastatin (LIPITOR) 40 MG tablet Take 1 tablet (40 mg total) by mouth daily. 90 tablet 3  . omeprazole (PRILOSEC OTC) 20 MG tablet Take 20 mg by mouth daily before breakfast.    . clonazePAM (KLONOPIN) 1 MG tablet Take 1-1.5 tablets (1-1.5 mg total) by mouth 2 (two) times daily as needed for anxiety. 20 tablet 1  . FLUoxetine (PROZAC) 20 MG capsule Take 1 capsule (20 mg total) by mouth daily. 90 capsule 0   No facility-administered medications prior to visit.    Allergies  Allergen Reactions  . Feldene [Piroxicam] Anaphylaxis    ROS Review of Systems Per hpi     Objective:    Physical  Exam Constitutional:      General: He is not in acute distress.    Appearance: Normal appearance. He is normal weight. He is not ill-appearing, toxic-appearing or diaphoretic.  Cardiovascular:     Rate and Rhythm: Normal rate and regular rhythm.     Heart sounds: Normal heart sounds. No murmur heard. No friction rub. No gallop.   Pulmonary:     Effort: Pulmonary effort is normal. No respiratory distress.     Breath sounds: Normal breath sounds. No stridor. No wheezing, rhonchi or rales.  Chest:     Chest wall: No tenderness.  Neurological:     General: No focal deficit present.     Mental Status: He is alert and oriented to person, place, and time. Mental status is at baseline.  Psychiatric:        Mood and Affect: Mood normal.        Behavior: Behavior normal.         Thought Content: Thought content normal.        Judgment: Judgment normal.     BP 132/80   Pulse 69   Temp 98 F (36.7 C) (Temporal)   Resp 18   Ht 5\' 9"  (1.753 m)   Wt 188 lb 3.2 oz (85.4 kg)   SpO2 97%   BMI 27.79 kg/m  Wt Readings from Last 3 Encounters:  10/01/20 188 lb 3.2 oz (85.4 kg)  05/27/20 186 lb 6.4 oz (84.6 kg)  04/29/20 188 lb 3.2 oz (85.4 kg)     Health Maintenance Due  Topic Date Due  . COVID-19 Vaccine (2 - Booster for Janssen series) 01/30/2020    There are no preventive care reminders to display for this patient.  Lab Results  Component Value Date   TSH 3.190 10/17/2019   Lab Results  Component Value Date   WBC 6.7 10/17/2019   HGB 14.2 10/17/2019   HCT 40.7 10/17/2019   MCV 93 10/17/2019   PLT 253 10/17/2019   Lab Results  Component Value Date   NA 136 10/17/2019   K 4.3 10/17/2019   CO2 19 (L) 10/17/2019   GLUCOSE 97 10/17/2019   BUN 12 10/17/2019   CREATININE 0.81 10/17/2019   BILITOT 0.3 10/17/2019   ALKPHOS 94 10/17/2019   AST 27 10/17/2019   ALT 43 10/17/2019   PROT 7.6 10/17/2019   ALBUMIN 5.0 (H) 10/17/2019   CALCIUM 9.9 10/17/2019   GFR 104.64 05/26/2011   Lab Results  Component Value Date   CHOL 239 (H) 10/17/2019   Lab Results  Component Value Date   HDL 37 (L) 10/17/2019   Lab Results  Component Value Date   LDLCALC 144 (H) 10/17/2019   Lab Results  Component Value Date   TRIG 319 (H) 10/17/2019   Lab Results  Component Value Date   CHOLHDL 6.5 (H) 10/17/2019   Lab Results  Component Value Date   HGBA1C 5.7 (H) 10/17/2019      Assessment & Plan:   Problem List Items Addressed This Visit   None   Visit Diagnoses    Bilateral hand pain    -  Primary   Relevant Orders   Vitamin D, 25-hydroxy   CBC With Differential   ANA   GAD (generalized anxiety disorder)       Relevant Medications   FLUoxetine (PROZAC) 40 MG capsule   clonazePAM (KLONOPIN) 2 MG tablet   Other Relevant Orders   Vitamin D,  25-hydroxy   CBC With  Differential   ANA   Wheezing       Relevant Medications   albuterol (VENTOLIN HFA) 108 (90 Base) MCG/ACT inhaler   Other Relevant Orders   Vitamin D, 25-hydroxy   CBC With Differential   ANA   Radicular pain in left arm       Relevant Medications   predniSONE (STERAPRED UNI-PAK 21 TAB) 10 MG (21) TBPK tablet   Other Relevant Orders   Vitamin D, 25-hydroxy   CBC With Differential   ANA      Meds ordered this encounter  Medications  . albuterol (VENTOLIN HFA) 108 (90 Base) MCG/ACT inhaler    Sig: Inhale 2 puffs into the lungs every 6 (six) hours as needed for wheezing or shortness of breath.    Dispense:  8 g    Refill:  0    Order Specific Question:   Supervising Provider    Answer:   Neva Seat, Avan R [[id number]]  . predniSONE (STERAPRED UNI-PAK 21 TAB) 10 MG (21) TBPK tablet    Sig: Take per package instructions. Do not skip doses. Finish entire supply.    Dispense:  1 each    Refill:  0    Order Specific Question:   Supervising Provider    Answer:   Neva Seat, Terrence R [[id number]]  . FLUoxetine (PROZAC) 40 MG capsule    Sig: Take 1 capsule (40 mg total) by mouth daily.    Dispense:  90 capsule    Refill:  3    Order Specific Question:   Supervising Provider    Answer:   Neva Seat, Devontay R [[id number]]  . clonazePAM (KLONOPIN) 2 MG tablet    Sig: Take 0.5-1 tablets (1-2 mg total) by mouth 2 (two) times daily.    Dispense:  90 tablet    Refill:  0    Order Specific Question:   Supervising Provider    Answer:   Neva Seat, Eaden R [[id number]]    Follow-up: No follow-ups on file.   PLAN  Increase fluoxetine to 40mg  PO qd. May take 3-4 weeks to start showing improved effect  Increase clonazepam to 1-2mg  po qd prn.   Discussed more coping mechanisms  Discussed that low libido is a common side effect of anxiety, of fluoxetine, of the aging process, and potentially of low testosterone or other ongoing concerns. We can continue to work on this, as he acknowledges that  while he does not have mayn concerns, his wife does.   Return in 3 mo for med check, sooner with concerns.  Patient encouraged to call clinic with any questions, comments, or concerns.  I spent 49 minutes with this patient, more than 50% of which was spent counseling and/or educating.  , NP

## 2020-10-04 ENCOUNTER — Other Ambulatory Visit: Payer: Self-pay | Admitting: Registered Nurse

## 2020-10-04 DIAGNOSIS — F411 Generalized anxiety disorder: Secondary | ICD-10-CM

## 2020-10-08 ENCOUNTER — Other Ambulatory Visit: Payer: Self-pay | Admitting: Registered Nurse

## 2020-10-08 DIAGNOSIS — R6882 Decreased libido: Secondary | ICD-10-CM

## 2020-10-08 NOTE — Telephone Encounter (Signed)
Requested Prescriptions  Pending Prescriptions Disp Refills  . sildenafil (VIAGRA) 50 MG tablet [Pharmacy Med Name: SILDENAFIL 50 MG TABLET] 10 tablet 3    Sig: TAKE 1/2 TO 1 TABLET BY MOUTH DAILY AS NEEDED FOR ERECTILE DYSFUNCTION     Urology: Erectile Dysfunction Agents Passed - 10/08/2020  6:14 AM      Passed - Last BP in normal range    BP Readings from Last 1 Encounters:  10/01/20 132/80         Passed - Valid encounter within last 12 months    Recent Outpatient Visits          1 week ago Bilateral hand pain   [facility], Gerlene Burdock, NP   4 months ago GAD (generalized anxiety disorder)   [facility], Gerlene Burdock, NP   5 months ago GAD (generalized anxiety disorder)   [facility], Gerlene Burdock, NP   7 months ago GAD (generalized anxiety disorder)   [facility], Gerlene Burdock, NP   11 months ago Need for prophylactic vaccination against Streptococcus pneumoniae (pneumococcus)   [facility], Gerlene Burdock, NP

## 2020-11-04 ENCOUNTER — Other Ambulatory Visit: Payer: Self-pay | Admitting: Registered Nurse

## 2020-11-04 DIAGNOSIS — E782 Mixed hyperlipidemia: Secondary | ICD-10-CM

## 2020-11-05 ENCOUNTER — Encounter: Payer: Self-pay | Admitting: Registered Nurse

## 2020-11-05 NOTE — Telephone Encounter (Signed)
So far everything's doing good hope you enjoying your new job

## 2021-01-30 ENCOUNTER — Other Ambulatory Visit: Payer: Self-pay

## 2021-01-30 ENCOUNTER — Ambulatory Visit (INDEPENDENT_AMBULATORY_CARE_PROVIDER_SITE_OTHER): Payer: 59 | Admitting: Registered Nurse

## 2021-01-30 ENCOUNTER — Encounter: Payer: Self-pay | Admitting: Registered Nurse

## 2021-01-30 VITALS — BP 127/76 | HR 63 | Temp 98.2°F | Resp 18 | Ht 69.0 in | Wt 194.2 lb

## 2021-01-30 DIAGNOSIS — G44319 Acute post-traumatic headache, not intractable: Secondary | ICD-10-CM | POA: Diagnosis not present

## 2021-01-30 DIAGNOSIS — Z130 Encounter for screening for diseases of the blood and blood-forming organs and certain disorders involving the immune mechanism: Secondary | ICD-10-CM

## 2021-01-30 DIAGNOSIS — F411 Generalized anxiety disorder: Secondary | ICD-10-CM | POA: Diagnosis not present

## 2021-01-30 DIAGNOSIS — E782 Mixed hyperlipidemia: Secondary | ICD-10-CM

## 2021-01-30 DIAGNOSIS — R7303 Prediabetes: Secondary | ICD-10-CM | POA: Diagnosis not present

## 2021-01-30 DIAGNOSIS — Z1329 Encounter for screening for other suspected endocrine disorder: Secondary | ICD-10-CM

## 2021-01-30 DIAGNOSIS — Z13228 Encounter for screening for other metabolic disorders: Secondary | ICD-10-CM

## 2021-01-30 LAB — CBC WITH DIFFERENTIAL/PLATELET
Basophils Absolute: 0.1 10*3/uL (ref 0.0–0.1)
Basophils Relative: 1.3 % (ref 0.0–3.0)
Eosinophils Absolute: 0.3 10*3/uL (ref 0.0–0.7)
Eosinophils Relative: 5.6 % — ABNORMAL HIGH (ref 0.0–5.0)
HCT: 38.5 % — ABNORMAL LOW (ref 39.0–52.0)
Hemoglobin: 13.3 g/dL (ref 13.0–17.0)
Lymphocytes Relative: 30.5 % (ref 12.0–46.0)
Lymphs Abs: 1.8 10*3/uL (ref 0.7–4.0)
MCHC: 34.6 g/dL (ref 30.0–36.0)
MCV: 94.8 fl (ref 78.0–100.0)
Monocytes Absolute: 0.5 10*3/uL (ref 0.1–1.0)
Monocytes Relative: 8.2 % (ref 3.0–12.0)
Neutro Abs: 3.3 10*3/uL (ref 1.4–7.7)
Neutrophils Relative %: 54.4 % (ref 43.0–77.0)
Platelets: 189 10*3/uL (ref 150.0–400.0)
RBC: 4.06 Mil/uL — ABNORMAL LOW (ref 4.22–5.81)
RDW: 13 % (ref 11.5–15.5)
WBC: 6 10*3/uL (ref 4.0–10.5)

## 2021-01-30 LAB — COMPREHENSIVE METABOLIC PANEL
ALT: 20 U/L (ref 0–53)
AST: 18 U/L (ref 0–37)
Albumin: 4.4 g/dL (ref 3.5–5.2)
Alkaline Phosphatase: 71 U/L (ref 39–117)
BUN: 13 mg/dL (ref 6–23)
CO2: 21 mEq/L (ref 19–32)
Calcium: 9.2 mg/dL (ref 8.4–10.5)
Chloride: 104 mEq/L (ref 96–112)
Creatinine, Ser: 0.92 mg/dL (ref 0.40–1.50)
GFR: 91.55 mL/min (ref >60.00)
Glucose, Bld: 158 mg/dL — ABNORMAL HIGH (ref 70–99)
Potassium: 4 mEq/L (ref 3.5–5.1)
Sodium: 135 mEq/L (ref 135–145)
Total Bilirubin: 0.5 mg/dL (ref 0.2–1.2)
Total Protein: 7.1 g/dL (ref 6.0–8.3)

## 2021-01-30 LAB — LIPID PANEL
Cholesterol: 249 mg/dL — ABNORMAL HIGH (ref 0–200)
HDL: 37.3 mg/dL — ABNORMAL LOW (ref >39.00)
Total CHOL/HDL Ratio: 7
Triglycerides: 466 mg/dL — ABNORMAL HIGH (ref 0.0–149.0)

## 2021-01-30 LAB — LDL CHOLESTEROL, DIRECT: Direct LDL: 145 mg/dL

## 2021-01-30 LAB — HEMOGLOBIN A1C: Hgb A1c MFr Bld: 5.8 % (ref 4.6–6.5)

## 2021-01-30 LAB — TSH: TSH: 2.02 u[IU]/mL (ref 0.35–5.50)

## 2021-01-30 MED ORDER — ALPRAZOLAM 0.5 MG PO TABS: 0.5000 mg | ORAL_TABLET | Freq: Two times a day (BID) | ORAL | 0 refills | Status: DC | PRN

## 2021-01-30 MED ORDER — AMITRIPTYLINE HCL 10 MG PO TABS: 10.0000 mg | ORAL_TABLET | Freq: Every day | ORAL | 0 refills | Status: DC

## 2021-01-30 MED ORDER — EZETIMIBE 10 MG PO TABS: 10.0000 mg | ORAL_TABLET | Freq: Every day | ORAL | 3 refills | Status: DC

## 2021-01-30 MED ORDER — VENLAFAXINE HCL ER 37.5 MG PO CP24: 37.5000 mg | ORAL_CAPSULE | Freq: Every day | ORAL | 1 refills | Status: DC

## 2021-01-30 NOTE — Patient Instructions (Addendum)
Mr. Adrian Ayers -   Adrian Ayers to see you as always  In brief:  Anxiety: starting venlafaxine (effexor) 37.5mg  daily. Increase to 2 capsules (75mg  total) after 2-3 weeks if tolerating well. Have sent xanax 0.5mg  to take 1-2 times daily. May cause sedation. Otherwise I don't expect side effects.  Cholesterol: Will recheck today. Can start Zetia 10mg  daily if still elevated. I'll let you know how the labs look.  Headache: will order CT scan of head to rule out bleed or clot. Start amitriptyline 10mg  at bed time for headaches - ok to stop when headaches are resolved. I expect they'll last 2-3 weeks at most. Can consider increasing dose of this medication if they persist or worsen, can also consider neurology consult   Let's plan to touch base in about 6 weeks for med check. Can be in person or virtual.  Thank you  [name]     If you have lab work done today you will be contacted with your lab results within the next 2 weeks.  If you have not heard from then please contact . The fastest way to get your results is to register for My Chart.   IF you received an x-ray today, you will receive an invoice from [facility]. Please contact [facility] at [phone] with questions or concerns regarding your invoice.   IF you received labwork today, you will receive an invoice from [facility]. Please contact [facility] at [phone] with questions or concerns regarding your invoice.   Our billing staff will not be able to assist you with questions regarding bills from these companies.  You will be contacted with the lab results as soon as they are available. The fastest way to get your results is to activate your My Chart account. Instructions are located on the last page of this paperwork. If you have not heard from [phone] regarding the results in 2 weeks, please contact this office.

## 2021-01-30 NOTE — Progress Notes (Addendum)
Established Patient Office Visit  Subjective:  Patient ID: Adrian Ayers Glanz, male    DOB: 1961-07-07  Age: 60 y.o. MRN: [id number]  CC:  Chief Complaint  Patient presents with   Head Injury    Patient states he hit his head in the back of his Adrian Ayers. Patient states he would also like discuss medication.    HPI Adrian Ayers Verley presents for med check and head trauma  Med check Has stopped taking fluoxetine, citing no effect. Had been on clonazepam as well. Admits this did not help much. States he was in intense anxiety and family member gave him alprazolam which worked better. Acknowledges he shouldn't take other people's rx, and wants to get on his own regimen. No SI/HI.  Has stopped taking his atorvastatin due to muscle weakness in both legs. Recovered after stopping. No ongoing symptoms.    Head Trauma Hit his head in the back of his Adrian Ayers around 1 week ago Notes persistent headache since Using simple analgesics OTC with some effect Interested in more effective care. Denies ongoing neuro deficits Notes headaches improving  No ongoing swelling or bruising on head.   Past Medical History:  Diagnosis Date   GERD (gastroesophageal reflux disease)    Multiple allergies     History reviewed. No pertinent surgical history.  Family History  Problem Relation Age of Onset   Asthma Maternal Grandfather    Heart disease Maternal Grandfather    Lung cancer Father        smoked    Social History   Socioeconomic History   Marital status: Married    Spouse name: Not on file   Number of children: 3   Years of education: Not on file   Highest education level: Not on file  Occupational History   Occupation: heating and air business x 30 yrs  Tobacco Use   Smoking status: Former    Packs/day: 1.00    Years: 15.00    Pack years: 15.00    Types: Cigarettes    Quit date: 11/16/2005    Years since quitting: 15.2   Smokeless tobacco: Never  Vaping Use   Vaping Use: Never used   Substance and Sexual Activity   Alcohol use: Yes    Comment: occ   Drug use: No   Sexual activity: Yes  Other Topics Concern   Not on file  Social History Narrative   Not on file   Social Determinants of Health   Financial Resource Strain: Not on file  Food Insecurity: Not on file  Transportation Needs: Not on file  Physical Activity: Not on file  Stress: Not on file  Social Connections: Not on file  Intimate Partner Violence: Not on file    Outpatient Medications Prior to Visit  Medication Sig Dispense Refill   albuterol (VENTOLIN HFA) 108 (90 Base) MCG/ACT inhaler Inhale 2 puffs into the lungs every 6 (six) hours as needed for wheezing or shortness of breath. 8 g 0   omeprazole (PRILOSEC OTC) 20 MG tablet Take 20 mg by mouth daily before breakfast.     sildenafil (VIAGRA) 50 MG tablet TAKE 1/2 TO 1 TABLET BY MOUTH DAILY AS NEEDED FOR ERECTILE DYSFUNCTION 10 tablet 3   atorvastatin (LIPITOR) 40 MG tablet TAKE ONE TABLET BY MOUTH DAILY 90 tablet 3   clonazePAM (KLONOPIN) 2 MG tablet Take 0.5-1 tablets (1-2 mg total) by mouth 2 (two) times daily. 90 tablet 0   FLUoxetine (PROZAC) 40 MG capsule Take 1  capsule (40 mg total) by mouth daily. 90 capsule 3   predniSONE (STERAPRED UNI-PAK 21 TAB) 10 MG (21) TBPK tablet Take per package instructions. Do not skip doses. Finish entire supply. 1 each 0   No facility-administered medications prior to visit.    Allergies  Allergen Reactions   Feldene [Piroxicam] Anaphylaxis   Atorvastatin     ROS Review of Systems  Constitutional: Negative.   HENT: Negative.    Eyes: Negative.   Respiratory: Negative.    Cardiovascular: Negative.   Gastrointestinal: Negative.   Genitourinary: Negative.   Musculoskeletal: Negative.   Skin: Negative.   Neurological:  Positive for headaches.  Psychiatric/Behavioral: Negative.    All other systems reviewed and are negative.    Objective:    Physical Exam Constitutional:      General: He is  not in acute distress.    Appearance: Normal appearance. He is normal weight. He is not ill-appearing, toxic-appearing or diaphoretic.  Eyes:     General: Lids are normal. Vision grossly intact.     Extraocular Movements:     Right eye: Normal extraocular motion.     Left eye: Abnormal extraocular motion (baseline "wandering eye", states he has had this worked up in the past, negative for underlying neuro process.) present.  Cardiovascular:     Rate and Rhythm: Normal rate and regular rhythm.     Heart sounds: Normal heart sounds. No murmur heard.   No friction rub. No gallop.  Pulmonary:     Effort: Pulmonary effort is normal. No respiratory distress.     Breath sounds: Normal breath sounds. No stridor. No wheezing, rhonchi or rales.  Chest:     Chest wall: No tenderness.  Neurological:     General: No focal deficit present.     Mental Status: He is alert and oriented to person, place, and time. Mental status is at baseline.     GCS: GCS eye subscore is 4. GCS verbal subscore is 5. GCS motor subscore is 6.     Cranial Nerves: Cranial nerves are intact.     Sensory: Sensation is intact.     Motor: Motor function is intact.     Coordination: Coordination is intact. Romberg sign negative. Coordination normal. Finger-Nose-Finger Test and Heel to [facility] Test normal. Rapid alternating movements normal.     Gait: Gait is intact.  Psychiatric:        Mood and Affect: Mood normal.        Behavior: Behavior normal.        Thought Content: Thought content normal.        Judgment: Judgment normal.    BP 127/76   Pulse 63   Temp 98.2 F (36.8 C) (Temporal)   Resp 18   Ht 5\' 9"  (1.753 m)   Wt 194 lb 3.2 oz (88.1 kg)   SpO2 99%   BMI 28.68 kg/m  Wt Readings from Last 3 Encounters:  01/30/21 194 lb 3.2 oz (88.1 kg)  10/01/20 188 lb 3.2 oz (85.4 kg)  05/27/20 186 lb 6.4 oz (84.6 kg)     There are no preventive care reminders to display for this patient.  There are no preventive care  reminders to display for this patient.  Lab Results  Component Value Date   TSH 2.02 01/30/2021   Lab Results  Component Value Date   WBC 6.0 01/30/2021   HGB 13.3 01/30/2021   HCT 38.5 (L) 01/30/2021   MCV 94.8 01/30/2021   PLT  189.0 01/30/2021   Lab Results  Component Value Date   NA 135 01/30/2021   K 4.0 01/30/2021   CO2 21 01/30/2021   GLUCOSE 158 (Ayers) 01/30/2021   BUN 13 01/30/2021   CREATININE 0.92 01/30/2021   BILITOT 0.5 01/30/2021   ALKPHOS 71 01/30/2021   AST 18 01/30/2021   ALT 20 01/30/2021   PROT 7.1 01/30/2021   ALBUMIN 4.4 01/30/2021   CALCIUM 9.2 01/30/2021   GFR 91.55 01/30/2021   Lab Results  Component Value Date   CHOL 249 (Ayers) 01/30/2021   Lab Results  Component Value Date   HDL 37.30 (L) 01/30/2021   Lab Results  Component Value Date   LDLCALC 144 (Ayers) 10/17/2019   Lab Results  Component Value Date   TRIG (Ayers) 01/30/2021    466.0 Triglyceride is over 400; calculations on Lipids are invalid.   Lab Results  Component Value Date   CHOLHDL 7 01/30/2021   Lab Results  Component Value Date   HGBA1C 5.8 01/30/2021      Assessment & Plan:   Problem List Items Addressed This Visit   None Visit Diagnoses     Acute post-traumatic headache, not intractable    -  Primary   Relevant Medications   venlafaxine XR (EFFEXOR XR) 37.5 MG 24 hr capsule   amitriptyline (ELAVIL) 10 MG tablet   Other Relevant Orders   CT Head Wo Contrast   GAD (generalized anxiety disorder)       Relevant Medications   ALPRAZolam (XANAX) 0.5 MG tablet   venlafaxine XR (EFFEXOR XR) 37.5 MG 24 hr capsule   amitriptyline (ELAVIL) 10 MG tablet   Mixed hyperlipidemia       Relevant Medications   ezetimibe (ZETIA) 10 MG tablet   Other Relevant Orders   Lipid panel (Completed)   LDL cholesterol, direct (Completed)   Prediabetes       Relevant Orders   Hemoglobin A1c (Completed)   Screening for endocrine, metabolic and immunity disorder       Relevant Orders    CBC with Differential/Platelet (Completed)   Comprehensive metabolic panel (Completed)   TSH (Completed)       Meds ordered this encounter  Medications   ALPRAZolam (XANAX) 0.5 MG tablet    Sig: Take 1 tablet (0.5 mg total) by mouth 2 (two) times daily as needed for anxiety.    Dispense:  30 tablet    Refill:  0    Order Specific Question:   Supervising Provider    Answer:   Neva Seat, Griffyn R [[id number]]   venlafaxine XR (EFFEXOR XR) 37.5 MG 24 hr capsule    Sig: Take 1 capsule (37.5 mg total) by mouth daily with breakfast.    Dispense:  90 capsule    Refill:  1    Order Specific Question:   Supervising Provider    Answer:   Neva Seat, Kannen R [[id number]]   ezetimibe (ZETIA) 10 MG tablet    Sig: Take 1 tablet (10 mg total) by mouth daily.    Dispense:  90 tablet    Refill:  3    Order Specific Question:   Supervising Provider    Answer:   Neva Seat, Detric R [[id number]]   amitriptyline (ELAVIL) 10 MG tablet    Sig: Take 1 tablet (10 mg total) by mouth at bedtime.    Dispense:  90 tablet    Refill:  0    Order Specific Question:   Supervising Provider    Answer:  GREENE, Asmar R [[id number]]    Follow-up: Return in about 6 weeks (around 03/13/2021) for med check - venlafaxine.   PLAN Headache: some concern for his baseline abnormal eom masking effect. Can order CT head wo contrast to rule out subdural / mass effect etc. Will start low dose amitriptyline 10mg  po qhs until headaches resolve. Ok to continue otc analgesic as well Anxiety: start venlafaxine 37.5mg  PO qd. Increase to 75mg  after 2-3 weeks if tolerating well. Short course of alprazolam 0.5mg  PO bid PRN sent. Discussed risks and benefits of each med and patient voices understanding. Will recheck cholesterol. In anticipation of elevation without his statin, will start Zetia.  Patient encouraged to call clinic with any questions, comments, or concerns.  I spent 47 minutes with this patient coordinating imaging, following up on adverse  medication reaction and starting 4 new medications   , NP

## 2021-02-09 ENCOUNTER — Ambulatory Visit (INDEPENDENT_AMBULATORY_CARE_PROVIDER_SITE_OTHER)
Admission: RE | Admit: 2021-02-09 | Discharge: 2021-02-09 | Disposition: A | Discharge status: Home / Self Care | Payer: 59 | Source: Ambulatory Visit | Attending: Registered Nurse | Admitting: Registered Nurse

## 2021-02-09 ENCOUNTER — Other Ambulatory Visit: Payer: Self-pay

## 2021-02-09 DIAGNOSIS — G44319 Acute post-traumatic headache, not intractable: Secondary | ICD-10-CM | POA: Diagnosis not present

## 2021-02-10 ENCOUNTER — Encounter: Payer: Self-pay | Admitting: Registered Nurse

## 2021-03-18 ENCOUNTER — Other Ambulatory Visit: Payer: Self-pay

## 2021-03-18 ENCOUNTER — Ambulatory Visit (INDEPENDENT_AMBULATORY_CARE_PROVIDER_SITE_OTHER): Payer: 59 | Admitting: Registered Nurse

## 2021-03-18 ENCOUNTER — Encounter: Payer: Self-pay | Admitting: Registered Nurse

## 2021-03-18 VITALS — BP 115/70 | HR 67 | Temp 98.1°F | Resp 18 | Ht 69.0 in | Wt 191.6 lb

## 2021-03-18 DIAGNOSIS — Z125 Encounter for screening for malignant neoplasm of prostate: Secondary | ICD-10-CM

## 2021-03-18 DIAGNOSIS — R350 Frequency of micturition: Secondary | ICD-10-CM

## 2021-03-18 DIAGNOSIS — F439 Reaction to severe stress, unspecified: Secondary | ICD-10-CM | POA: Insufficient documentation

## 2021-03-18 DIAGNOSIS — F411 Generalized anxiety disorder: Secondary | ICD-10-CM | POA: Diagnosis not present

## 2021-03-18 DIAGNOSIS — R062 Wheezing: Secondary | ICD-10-CM | POA: Insufficient documentation

## 2021-03-18 LAB — PSA: PSA: 0.36 ng/mL (ref 0.10–4.00)

## 2021-03-18 MED ORDER — TAMSULOSIN HCL 0.4 MG PO CAPS: 0.4000 mg | ORAL_CAPSULE | Freq: Every day | ORAL | 3 refills | Status: DC

## 2021-03-18 MED ORDER — VENLAFAXINE HCL ER 37.5 MG PO CP24: 37.5000 mg | ORAL_CAPSULE | Freq: Every day | ORAL | 1 refills | Status: DC

## 2021-03-18 MED ORDER — ALBUTEROL SULFATE HFA 108 (90 BASE) MCG/ACT IN AERS: 2.0000 | INHALATION_SPRAY | Freq: Four times a day (QID) | RESPIRATORY_TRACT | 5 refills | Status: DC | PRN

## 2021-03-18 MED ORDER — ALPRAZOLAM 1 MG PO TABS: 0.5000 mg | ORAL_TABLET | Freq: Two times a day (BID) | ORAL | 1 refills | Status: DC | PRN

## 2021-03-18 NOTE — Patient Instructions (Signed)
Mr Adrian Ayers to see you  Checking PSA on blood work as general marker for prostate cancer.  Will double check on last colonoscopy and follow up protocol - cologuard if warranted.  Try flomax once daily - can help with urinary concerns.   Increasing alprazolam (xanax)  See you in 6 weeks  Thanks,  Rich

## 2021-03-18 NOTE — Progress Notes (Signed)
Established Patient Office Visit  Subjective:  Patient ID: Adrian Ayers, male    DOB: Mar 03, 1961  Age: 60 y.o. MRN: [id number]  CC:  Chief Complaint  Patient presents with   Follow-up    Med recheck    HPI Adrian Ayers presents for med check  Last visit - 6 weeks ago - had started on effexor In past had tried and failed fluoxetine (no effect), hydroxyzine (subtherapeutic), buspirone (subtherapeutic), adderall (Aes), lexapro (subtherapeutic).  He has also been on alprazolam 0.5mg  po bid prn for breakthrough anxiety.   Reports that effexor has generally been helpful, though not taking it every day Alprazolam has been less useful as he notes that his breakthrough anxiety has been higher with a lot of stress at home. Would be interested in couples counseling, perhaps individual counseling as well.  Confides today that he experienced a number of events growing up that would be considered Adverse Childhood Experiences. He does willingly acknowledge that his past trauma effects his current mental health and worldview.    Also notes frequent urination. Often smaller amounts than used to. Sometimes has to push to start stream. Often feeling of incomplete emptying. Limited nocturia if voiding before bed. Would be interested in checking PSA and consider consult to urology.   Finally, concern for colon ca screening. Occ constipation but no otherwise no symptoms. Colonoscopy in 2019 with polyps, 5 year recall suggested. Notes a fam hx of colon ca.  Otherwise no acute concerns.   Past Medical History:  Diagnosis Date   GERD (gastroesophageal reflux disease)    Multiple allergies     History reviewed. No pertinent surgical history.  Family History  Problem Relation Age of Onset   Asthma Maternal Grandfather    Heart disease Maternal Grandfather    Lung cancer Father        smoked    Social History   Socioeconomic History   Marital status: Married    Spouse name: Not on  file   Number of children: 3   Years of education: Not on file   Highest education level: Not on file  Occupational History   Occupation: heating and air business x 30 yrs  Tobacco Use   Smoking status: Former    Packs/day: 1.00    Years: 15.00    Pack years: 15.00    Types: Cigarettes    Quit date: 11/16/2005    Years since quitting: 15.3   Smokeless tobacco: Never  Vaping Use   Vaping Use: Never used  Substance and Sexual Activity   Alcohol use: Yes    Comment: occ   Drug use: No   Sexual activity: Yes  Other Topics Concern   Not on file  Social History Narrative   Not on file   Social Determinants of Health   Financial Resource Strain: Not on file  Food Insecurity: Not on file  Transportation Needs: Not on file  Physical Activity: Not on file  Stress: Not on file  Social Connections: Not on file  Intimate Partner Violence: Not on file    Outpatient Medications Prior to Visit  Medication Sig Dispense Refill   amitriptyline (ELAVIL) 10 MG tablet Take 1 tablet (10 mg total) by mouth at bedtime. 90 tablet 0   ezetimibe (ZETIA) 10 MG tablet Take 1 tablet (10 mg total) by mouth daily. 90 tablet 3   omeprazole (PRILOSEC OTC) 20 MG tablet Take 20 mg by mouth daily before breakfast.     albuterol (  VENTOLIN HFA) 108 (90 Base) MCG/ACT inhaler Inhale 2 puffs into the lungs every 6 (six) hours as needed for wheezing or shortness of breath. 8 g 0   ALPRAZolam (XANAX) 0.5 MG tablet Take 1 tablet (0.5 mg total) by mouth 2 (two) times daily as needed for anxiety. 30 tablet 0   venlafaxine XR (EFFEXOR XR) 37.5 MG 24 hr capsule Take 1 capsule (37.5 mg total) by mouth daily with breakfast. 90 capsule 1   sildenafil (VIAGRA) 50 MG tablet TAKE 1/2 TO 1 TABLET BY MOUTH DAILY AS NEEDED FOR ERECTILE DYSFUNCTION (Patient not taking: Reported on 03/18/2021) 10 tablet 3   No facility-administered medications prior to visit.    Allergies  Allergen Reactions   Feldene [Piroxicam] Anaphylaxis    Atorvastatin     ROS Review of Systems  Constitutional: Negative.   HENT: Negative.    Eyes: Negative.   Respiratory: Negative.    Cardiovascular: Negative.   Gastrointestinal: Negative.   Endocrine: Negative.   Genitourinary:  Positive for difficulty urinating.  Musculoskeletal: Negative.   Skin: Negative.   Allergic/Immunologic: Negative.   Neurological: Negative.   Hematological: Negative.   Psychiatric/Behavioral:  Positive for agitation. The patient is nervous/anxious.   All other systems reviewed and are negative.    Objective:    Physical Exam Constitutional:      General: He is not in acute distress.    Appearance: Normal appearance. He is normal weight. He is not ill-appearing, toxic-appearing or diaphoretic.  Cardiovascular:     Rate and Rhythm: Normal rate and regular rhythm.     Heart sounds: Normal heart sounds. No murmur heard.   No friction rub. No gallop.  Pulmonary:     Effort: Pulmonary effort is normal. No respiratory distress.     Breath sounds: Normal breath sounds. No stridor. No wheezing, rhonchi or rales.  Chest:     Chest wall: No tenderness.  Neurological:     General: No focal deficit present.     Mental Status: He is alert and oriented to person, place, and time. Mental status is at baseline.  Psychiatric:        Mood and Affect: Mood normal.        Behavior: Behavior normal.        Thought Content: Thought content normal.        Judgment: Judgment normal.    BP 115/70   Pulse 67   Temp 98.1 F (36.7 C) (Temporal)   Resp 18   Ht 5\' 9"  (1.753 m)   Wt 191 lb 9.6 oz (86.9 kg)   SpO2 98%   BMI 28.29 kg/m  Wt Readings from Last 3 Encounters:  03/18/21 191 lb 9.6 oz (86.9 kg)  01/30/21 194 lb 3.2 oz (88.1 kg)  10/01/20 188 lb 3.2 oz (85.4 kg)     Health Maintenance Due  Topic Date Due   COVID-19 Vaccine (2 - Booster for Janssen series) 01/30/2020   INFLUENZA VACCINE  02/16/2021    There are no preventive care reminders to  display for this patient.  Lab Results  Component Value Date   TSH 2.02 01/30/2021   Lab Results  Component Value Date   WBC 6.0 01/30/2021   HGB 13.3 01/30/2021   HCT 38.5 (L) 01/30/2021   MCV 94.8 01/30/2021   PLT 189.0 01/30/2021   Lab Results  Component Value Date   NA 135 01/30/2021   K 4.0 01/30/2021   CO2 21 01/30/2021   GLUCOSE 158 (H)  01/30/2021   BUN 13 01/30/2021   CREATININE 0.92 01/30/2021   BILITOT 0.5 01/30/2021   ALKPHOS 71 01/30/2021   AST 18 01/30/2021   ALT 20 01/30/2021   PROT 7.1 01/30/2021   ALBUMIN 4.4 01/30/2021   CALCIUM 9.2 01/30/2021   GFR 91.55 01/30/2021   Lab Results  Component Value Date   CHOL 249 (H) 01/30/2021   Lab Results  Component Value Date   HDL 37.30 (L) 01/30/2021   Lab Results  Component Value Date   LDLCALC 144 (H) 10/17/2019   Lab Results  Component Value Date   TRIG (H) 01/30/2021    466.0 Triglyceride is over 400; calculations on Lipids are invalid.   Lab Results  Component Value Date   CHOLHDL 7 01/30/2021   Lab Results  Component Value Date   HGBA1C 5.8 01/30/2021      Assessment & Plan:   Problem List Items Addressed This Visit       Other   GAD (generalized anxiety disorder) - Primary   Relevant Medications   venlafaxine XR (EFFEXOR XR) 37.5 MG 24 hr capsule   ALPRAZolam (XANAX) 1 MG tablet   Other Relevant Orders   Ambulatory referral to Psychology   Stress at home   Relevant Orders   Ambulatory referral to Psychology   Wheezing   Relevant Medications   albuterol (VENTOLIN HFA) 108 (90 Base) MCG/ACT inhaler   Other Visit Diagnoses     Frequent urination       Relevant Medications   tamsulosin (FLOMAX) 0.4 MG CAPS capsule   Screening PSA (prostate specific antigen)       Relevant Orders   PSA       Meds ordered this encounter  Medications   albuterol (VENTOLIN HFA) 108 (90 Base) MCG/ACT inhaler    Sig: Inhale 2 puffs into the lungs every 6 (six) hours as needed for wheezing  or shortness of breath.    Dispense:  8 g    Refill:  5   tamsulosin (FLOMAX) 0.4 MG CAPS capsule    Sig: Take 1 capsule (0.4 mg total) by mouth daily.    Dispense:  30 capsule    Refill:  3    Order Specific Question:   Supervising Provider    Answer:   Neva Seat, Jocelyn R [[id number]]   venlafaxine XR (EFFEXOR XR) 37.5 MG 24 hr capsule    Sig: Take 1 capsule (37.5 mg total) by mouth daily with breakfast.    Dispense:  90 capsule    Refill:  1    Order Specific Question:   Supervising Provider    Answer:   Neva Seat, Bowdy R [[id number]]   ALPRAZolam (XANAX) 1 MG tablet    Sig: Take 0.5-1 tablets (0.5-1 mg total) by mouth 2 (two) times daily as needed for anxiety.    Dispense:  90 tablet    Refill:  1    Order Specific Question:   Supervising Provider    Answer:   Neva Seat, Verdell R [[id number]]    Follow-up: Return in about 6 weeks (around 04/29/2021).   PLAN Refer to counseling - ideally will set up individual and couples counseling. Will work to explore bilingual counselors for him and his wife. Increase alprazolam to 0.5-1mg  po bid PRN for breakthrough anxiety. Reviewed risks, benefits, and alternatives with patient who voices understanding Recommend daily use of effexor for best effect. Return in 6 weeks for med check PSA drawn today, follow up as warranted. Colon ca screening not  due until 2024. Will consider sooner with any concerning symptoms. Patient encouraged to call clinic with any questions, comments, or concerns.  I spent 62 minutes with this patient counseling, changing medications, coordinating referrals, discussing medication safety including high risk medication, and addressing both acute and health maintenance concerns.  [name], NP

## 2021-03-30 ENCOUNTER — Other Ambulatory Visit: Payer: Self-pay | Admitting: Registered Nurse

## 2021-03-30 DIAGNOSIS — F439 Reaction to severe stress, unspecified: Secondary | ICD-10-CM

## 2021-03-30 DIAGNOSIS — F411 Generalized anxiety disorder: Secondary | ICD-10-CM

## 2021-04-24 ENCOUNTER — Encounter: Payer: Self-pay | Admitting: Registered Nurse

## 2021-04-24 NOTE — Telephone Encounter (Signed)
Please have patient be seen today - urgent care may best option as infection on face may need immediate attention, possible shingles.

## 2021-04-26 ENCOUNTER — Other Ambulatory Visit: Payer: Self-pay | Admitting: Registered Nurse

## 2021-04-26 DIAGNOSIS — G44319 Acute post-traumatic headache, not intractable: Secondary | ICD-10-CM

## 2021-04-28 NOTE — Telephone Encounter (Signed)
Noted -  agree with urgent care  Thank you Dr. Neva Seat -   Luan Pulling

## 2021-04-29 ENCOUNTER — Telehealth (INDEPENDENT_AMBULATORY_CARE_PROVIDER_SITE_OTHER): Payer: 59 | Admitting: Registered Nurse

## 2021-04-29 ENCOUNTER — Other Ambulatory Visit: Payer: Self-pay

## 2021-04-29 ENCOUNTER — Encounter: Payer: Self-pay | Admitting: Registered Nurse

## 2021-04-29 DIAGNOSIS — K047 Periapical abscess without sinus: Secondary | ICD-10-CM

## 2021-04-29 MED ORDER — AMOXICILLIN-POT CLAVULANATE 875-125 MG PO TABS: 1.0000 | ORAL_TABLET | Freq: Two times a day (BID) | ORAL | 0 refills | Status: DC

## 2021-04-29 NOTE — Patient Instructions (Signed)
° ° ° °  If you have lab work done today you will be contacted with your lab results within the next 2 weeks.  If you have not heard from us then please contact us. The fastest way to get your results is to register for My Chart. ° ° °IF you received an x-ray today, you will receive an invoice from [facility]. Please contact [facility] at [phone] with questions or concerns regarding your invoice.  ° °IF you received labwork today, you will receive an invoice from [facility]. Please contact [facility] at [phone] with questions or concerns regarding your invoice.  ° °Our billing staff will not be able to assist you with questions regarding bills from these companies. ° °You will be contacted with the lab results as soon as they are available. The fastest way to get your results is to activate your My Chart account. Instructions are located on the last page of this paperwork. If you have not heard from us regarding the results in 2 weeks, please contact this office. °  ° ° ° °

## 2021-04-29 NOTE — Progress Notes (Signed)
Telemedicine Encounter- SOAP NOTE Established Patient  This telephone encounter was conducted with the patient's (or proxy's) verbal consent via audio telecommunications: yes/no: Yes Patient was instructed to have this encounter in a suitably private space; and to only have persons present to whom they give permission to participate. In addition, patient identity was confirmed by use of name plus two identifiers (DOB and address).  I discussed the limitations, risks, security and privacy concerns of performing an evaluation and management service by telephone and the availability of in person appointments. I also discussed with the patient that there may be a patient responsible charge related to this service. The patient expressed understanding and agreed to proceed.  I spent a total of 18 minutes talking with the patient or their proxy.  Patient at home Provider in office  Participants: Jari Sportsman, NP and Elmyra Ricks  Chief Complaint  Patient presents with   Follow-up    Patient states he is following up for anxiety. Patient also having some gum swelling and needs an antibiotic and might need a referral for the dentist     Subjective   Adrian Ayers is a 60 y.o. established patient. Telephone visit today for follow up, gum swelling  HPI Follow up Feels like things are going well. Doing well with flomax - helping him sleep with less nocturia  Gum swelling Swelling in upper cheek - some swelling in gums, puffiness in face, pressure in sinuses.  Feels like infection.  No defined abscess or subdermal lesion No dysphagia   Patient Active Problem List   Diagnosis Date Noted   GAD (generalized anxiety disorder) 03/18/2021   Stress at home 03/18/2021   Wheezing 03/18/2021   History of adenomatous polyp of colon 04/29/2017   Dyspnea 05/26/2011   Chronic rhinitis 05/26/2011    Past Medical History:  Diagnosis Date   GERD (gastroesophageal reflux disease)     Multiple allergies     Current Outpatient Medications  Medication Sig Dispense Refill   albuterol (VENTOLIN HFA) 108 (90 Base) MCG/ACT inhaler Inhale 2 puffs into the lungs every 6 (six) hours as needed for wheezing or shortness of breath. 8 g 5   ALPRAZolam (XANAX) 1 MG tablet Take 0.5-1 tablets (0.5-1 mg total) by mouth 2 (two) times daily as needed for anxiety. 90 tablet 1   amitriptyline (ELAVIL) 10 MG tablet TAKE ONE TABLET BY MOUTH AT BEDTIME 90 tablet 0   amoxicillin-clavulanate (AUGMENTIN) 875-125 MG tablet Take 1 tablet by mouth 2 (two) times daily. 20 tablet 0   ezetimibe (ZETIA) 10 MG tablet Take 1 tablet (10 mg total) by mouth daily. 90 tablet 3   omeprazole (PRILOSEC OTC) 20 MG tablet Take 20 mg by mouth daily before breakfast.     tamsulosin (FLOMAX) 0.4 MG CAPS capsule Take 1 capsule (0.4 mg total) by mouth daily. 30 capsule 3   venlafaxine XR (EFFEXOR XR) 37.5 MG 24 hr capsule Take 1 capsule (37.5 mg total) by mouth daily with breakfast. 90 capsule 1   No current facility-administered medications for this visit.    Allergies  Allergen Reactions   Feldene [Piroxicam] Anaphylaxis   Atorvastatin     Social History   Socioeconomic History   Marital status: Married    Spouse name: Not on file   Number of children: 3   Years of education: Not on file   Highest education level: Not on file  Occupational History   Occupation: heating and air business x 30  yrs  Tobacco Use   Smoking status: Former    Packs/day: 1.00    Years: 15.00    Pack years: 15.00    Types: Cigarettes    Quit date: 11/16/2005    Years since quitting: 15.4   Smokeless tobacco: Never  Vaping Use   Vaping Use: Never used  Substance and Sexual Activity   Alcohol use: Yes    Comment: occ   Drug use: No   Sexual activity: Yes  Other Topics Concern   Not on file  Social History Narrative   Not on file   Social Determinants of Health   Financial Resource Strain: Not on file  Food Insecurity:  Not on file  Transportation Needs: Not on file  Physical Activity: Not on file  Stress: Not on file  Social Connections: Not on file  Intimate Partner Violence: Not on file    Review of Systems  Constitutional: Negative.   HENT: Negative.    Eyes: Negative.   Respiratory: Negative.    Cardiovascular: Negative.   Gastrointestinal: Negative.   Genitourinary: Negative.   Musculoskeletal: Negative.   Skin: Negative.   Neurological: Negative.   Endo/Heme/Allergies: Negative.   Psychiatric/Behavioral: Negative.    All other systems reviewed and are negative.  Objective   Vitals as reported by the patient: There were no vitals filed for this visit.  [name] was seen today for follow-up.  Diagnoses and all orders for this visit:  Dental infection -     amoxicillin-clavulanate (AUGMENTIN) 875-125 MG tablet; Take 1 tablet by mouth 2 (two) times daily.   PLAN Dental infection - augmentin as above. Will give dentistry recommendations in avs Anxiety going well. Maintain current regimen Return prn Patient encouraged to call clinic with any questions, comments, or concerns.  I discussed the assessment and treatment plan with the patient. The patient was provided an opportunity to ask questions and all were answered. The patient agreed with the plan and demonstrated an understanding of the instructions.   The patient was advised to call back or seek an in-person evaluation if the symptoms worsen or if the condition fails to improve as anticipated.  I provided 18 minutes of non-face-to-face time during this encounter.  Adrian Agee, NP

## 2021-07-20 ENCOUNTER — Other Ambulatory Visit: Payer: Self-pay | Admitting: Registered Nurse

## 2021-07-20 DIAGNOSIS — R350 Frequency of micturition: Secondary | ICD-10-CM

## 2021-07-29 ENCOUNTER — Other Ambulatory Visit: Payer: Self-pay | Admitting: Registered Nurse

## 2021-07-29 DIAGNOSIS — G44319 Acute post-traumatic headache, not intractable: Secondary | ICD-10-CM

## 2021-08-12 ENCOUNTER — Other Ambulatory Visit: Payer: Self-pay

## 2021-08-12 ENCOUNTER — Ambulatory Visit: Payer: 59 | Admitting: Registered Nurse

## 2021-08-12 ENCOUNTER — Encounter: Payer: Self-pay | Admitting: Registered Nurse

## 2021-08-12 VITALS — BP 127/73 | HR 67 | Temp 98.3°F | Resp 18 | Ht 69.0 in | Wt 198.3 lb

## 2021-08-12 DIAGNOSIS — F411 Generalized anxiety disorder: Secondary | ICD-10-CM | POA: Diagnosis not present

## 2021-08-12 DIAGNOSIS — E782 Mixed hyperlipidemia: Secondary | ICD-10-CM | POA: Diagnosis not present

## 2021-08-12 DIAGNOSIS — R062 Wheezing: Secondary | ICD-10-CM

## 2021-08-12 DIAGNOSIS — H938X3 Other specified disorders of ear, bilateral: Secondary | ICD-10-CM | POA: Diagnosis not present

## 2021-08-12 DIAGNOSIS — R350 Frequency of micturition: Secondary | ICD-10-CM

## 2021-08-12 MED ORDER — ALBUTEROL SULFATE HFA 108 (90 BASE) MCG/ACT IN AERS: 2.0000 | INHALATION_SPRAY | Freq: Four times a day (QID) | RESPIRATORY_TRACT | 5 refills | Status: DC | PRN

## 2021-08-12 MED ORDER — ALPRAZOLAM 1 MG PO TABS: 0.5000 mg | ORAL_TABLET | Freq: Two times a day (BID) | ORAL | 1 refills | Status: DC | PRN

## 2021-08-12 MED ORDER — TAMSULOSIN HCL 0.4 MG PO CAPS: 0.4000 mg | ORAL_CAPSULE | Freq: Every day | ORAL | 3 refills | Status: DC

## 2021-08-12 MED ORDER — EZETIMIBE 10 MG PO TABS: 10.0000 mg | ORAL_TABLET | Freq: Every day | ORAL | 3 refills | Status: DC

## 2021-08-12 MED ORDER — VENLAFAXINE HCL ER 37.5 MG PO CP24: 37.5000 mg | ORAL_CAPSULE | Freq: Every day | ORAL | 1 refills | Status: DC

## 2021-08-12 MED ORDER — DOXYCYCLINE HYCLATE 100 MG PO TABS: 100.0000 mg | ORAL_TABLET | Freq: Two times a day (BID) | ORAL | 0 refills | Status: DC

## 2021-08-12 MED ORDER — FLUTICASONE PROPIONATE 50 MCG/ACT NA SUSP: 2.0000 | Freq: Every day | NASAL | 6 refills | Status: DC

## 2021-08-12 MED ORDER — PREDNISONE 20 MG PO TABS
20.0000 mg | ORAL_TABLET | Freq: Every day | ORAL | 0 refills | Status: DC
Start: 2021-08-12 — End: 2023-02-03

## 2021-08-12 NOTE — Progress Notes (Signed)
Established Patient Office Visit  Subjective:  Patient ID: Adrian Ayers, male    DOB: 07/22/1960  Age: 61 y.o. MRN: [id number]  CC:  Chief Complaint  Patient presents with   Ear Pain    Patient states he has been having some pain in his left ear. Patient states he has been having trouble in this ear forever. Patient states he feels like he is getting pneumonia.    HPI Adrian Ayers presents for ear pain  Onset 1+ week ago. On and off trouble with this ear for some time. Baseline hearing deficit. More pressure lately. Now pressure starting on R ear as well Now developing into nasal congestion, rhinorrhea, pnd Notes he has had multiple cases of pna in the past and this is how they've always started.  Would like to seek tx before symptoms progress, worsen.   Needs refills on all meds No acute concerns Pdmp concerned  Past Medical History:  Diagnosis Date   GERD (gastroesophageal reflux disease)    Multiple allergies     History reviewed. No pertinent surgical history.  Family History  Problem Relation Age of Onset   Asthma Maternal Grandfather    Heart disease Maternal Grandfather    Lung cancer Father        smoked    Social History   Socioeconomic History   Marital status: Married    Spouse name: Not on file   Number of children: 3   Years of education: Not on file   Highest education level: Not on file  Occupational History   Occupation: heating and air business x 30 yrs  Tobacco Use   Smoking status: Former    Packs/day: 1.00    Years: 15.00    Pack years: 15.00    Types: Cigarettes    Quit date: 11/16/2005    Years since quitting: 15.7   Smokeless tobacco: Never  Vaping Use   Vaping Use: Never used  Substance and Sexual Activity   Alcohol use: Yes    Comment: occ   Drug use: No   Sexual activity: Yes  Other Topics Concern   Not on file  Social History Narrative   Not on file   Social Determinants of Health   Financial Resource Strain:  Not on file  Food Insecurity: Not on file  Transportation Needs: Not on file  Physical Activity: Not on file  Stress: Not on file  Social Connections: Not on file  Intimate Partner Violence: Not on file    Outpatient Medications Prior to Visit  Medication Sig Dispense Refill   amitriptyline (ELAVIL) 10 MG tablet TAKE ONE TABLET BY MOUTH AT BEDTIME 90 tablet 0   omeprazole (PRILOSEC OTC) 20 MG tablet Take 20 mg by mouth daily before breakfast.     albuterol (VENTOLIN HFA) 108 (90 Base) MCG/ACT inhaler Inhale 2 puffs into the lungs every 6 (six) hours as needed for wheezing or shortness of breath. 8 g 5   ALPRAZolam (XANAX) 1 MG tablet Take 0.5-1 tablets (0.5-1 mg total) by mouth 2 (two) times daily as needed for anxiety. 90 tablet 1   ezetimibe (ZETIA) 10 MG tablet Take 1 tablet (10 mg total) by mouth daily. 90 tablet 3   tamsulosin (FLOMAX) 0.4 MG CAPS capsule TAKE ONE CAPSULE BY MOUTH DAILY 90 capsule 3   venlafaxine XR (EFFEXOR XR) 37.5 MG 24 hr capsule Take 1 capsule (37.5 mg total) by mouth daily with breakfast. 90 capsule 1   amoxicillin-clavulanate (AUGMENTIN)  875-125 MG tablet Take 1 tablet by mouth 2 (two) times daily. 20 tablet 0   No facility-administered medications prior to visit.    Allergies  Allergen Reactions   Feldene [Piroxicam] Anaphylaxis   Atorvastatin     ROS Review of Systems  Constitutional: Negative.   HENT:  Positive for congestion and ear pain. Negative for ear discharge, sinus pressure, sinus pain, sneezing and sore throat.   Eyes: Negative.   Respiratory: Negative.  Negative for apnea, cough, choking, chest tightness, shortness of breath, wheezing and stridor.   Cardiovascular: Negative.   Gastrointestinal: Negative.   Genitourinary: Negative.   Musculoskeletal: Negative.   Skin: Negative.   Neurological: Negative.   Psychiatric/Behavioral: Negative.    All other systems reviewed and are negative.    Objective:    Physical  Exam Constitutional:      General: He is not in acute distress.    Appearance: Normal appearance. He is normal weight. He is not ill-appearing, toxic-appearing or diaphoretic.  HENT:     Right Ear: Decreased hearing noted. No swelling or tenderness. A middle ear effusion is present. Tympanic membrane is bulging. Tympanic membrane is not perforated.     Left Ear: Decreased hearing noted. No swelling or tenderness. A middle ear effusion is present. Tympanic membrane is bulging. Tympanic membrane is not perforated.  Cardiovascular:     Rate and Rhythm: Normal rate and regular rhythm.     Heart sounds: Normal heart sounds. No murmur heard.   No friction rub. No gallop.  Pulmonary:     Effort: Pulmonary effort is normal. No respiratory distress.     Breath sounds: Normal breath sounds. No stridor. No wheezing, rhonchi or rales.  Chest:     Chest wall: No tenderness.  Neurological:     General: No focal deficit present.     Mental Status: He is alert and oriented to person, place, and time. Mental status is at baseline.  Psychiatric:        Mood and Affect: Mood normal.        Behavior: Behavior normal.        Thought Content: Thought content normal.        Judgment: Judgment normal.    BP 127/73    Pulse 67    Temp 98.3 F (36.8 C) (Temporal)    Resp 18    Ht 5\' 9"  (1.753 m)    Wt 198 lb 4.8 oz (89.9 kg)    SpO2 99%    BMI 29.28 kg/m  Wt Readings from Last 3 Encounters:  08/12/21 198 lb 4.8 oz (89.9 kg)  03/18/21 191 lb 9.6 oz (86.9 kg)  01/30/21 194 lb 3.2 oz (88.1 kg)     Health Maintenance Due  Topic Date Due   Zoster Vaccines- Shingrix (1 of 2) Never done   COVID-19 Vaccine (2 - Booster for Janssen series) 01/30/2020    There are no preventive care reminders to display for this patient.  Lab Results  Component Value Date   TSH 2.02 01/30/2021   Lab Results  Component Value Date   WBC 6.0 01/30/2021   HGB 13.3 01/30/2021   HCT 38.5 (L) 01/30/2021   MCV 94.8  01/30/2021   PLT 189.0 01/30/2021   Lab Results  Component Value Date   NA 135 01/30/2021   K 4.0 01/30/2021   CO2 21 01/30/2021   GLUCOSE 158 (H) 01/30/2021   BUN 13 01/30/2021   CREATININE 0.92 01/30/2021   BILITOT 0.5  01/30/2021   ALKPHOS 71 01/30/2021   AST 18 01/30/2021   ALT 20 01/30/2021   PROT 7.1 01/30/2021   ALBUMIN 4.4 01/30/2021   CALCIUM 9.2 01/30/2021   GFR 91.55 01/30/2021   Lab Results  Component Value Date   CHOL 249 (H) 01/30/2021   Lab Results  Component Value Date   HDL 37.30 (L) 01/30/2021   Lab Results  Component Value Date   LDLCALC 144 (H) 10/17/2019   Lab Results  Component Value Date   TRIG (H) 01/30/2021    466.0 Triglyceride is over 400; calculations on Lipids are invalid.   Lab Results  Component Value Date   CHOLHDL 7 01/30/2021   Lab Results  Component Value Date   HGBA1C 5.8 01/30/2021      Assessment & Plan:   Problem List Items Addressed This Visit       Other   GAD (generalized anxiety disorder)   Relevant Medications   venlafaxine XR (EFFEXOR XR) 37.5 MG 24 hr capsule   ALPRAZolam (XANAX) 1 MG tablet   Wheezing   Relevant Medications   albuterol (VENTOLIN HFA) 108 (90 Base) MCG/ACT inhaler   Other Visit Diagnoses     Pressure sensation in both ears    -  Primary   Relevant Medications   doxycycline (VIBRA-TABS) 100 MG tablet   fluticasone (FLONASE) 50 MCG/ACT nasal spray   predniSONE (DELTASONE) 20 MG tablet   Other Relevant Orders   Ambulatory referral to ENT   Mixed hyperlipidemia       Relevant Medications   ezetimibe (ZETIA) 10 MG tablet   Frequent urination       Relevant Medications   tamsulosin (FLOMAX) 0.4 MG CAPS capsule       Meds ordered this encounter  Medications   doxycycline (VIBRA-TABS) 100 MG tablet    Sig: Take 1 tablet (100 mg total) by mouth 2 (two) times daily.    Dispense:  20 tablet    Refill:  0    Order Specific Question:   Supervising Provider    Answer:   Neva SeatGREENE,  Markelle R [[id number]]   fluticasone (FLONASE) 50 MCG/ACT nasal spray    Sig: Place 2 sprays into both nostrils daily.    Dispense:  16 g    Refill:  6    Order Specific Question:   Supervising Provider    Answer:   Neva SeatGREENE, Rodarius R [[id number]]   predniSONE (DELTASONE) 20 MG tablet    Sig: Take 1 tablet (20 mg total) by mouth daily with breakfast.    Dispense:  5 tablet    Refill:  0    Order Specific Question:   Supervising Provider    Answer:   Neva SeatGREENE, Jarrin R [[id number]]   venlafaxine XR (EFFEXOR XR) 37.5 MG 24 hr capsule    Sig: Take 1 capsule (37.5 mg total) by mouth daily with breakfast.    Dispense:  90 capsule    Refill:  1    Order Specific Question:   Supervising Provider    Answer:   Neva SeatGREENE, Coley R [[id number]]   albuterol (VENTOLIN HFA) 108 (90 Base) MCG/ACT inhaler    Sig: Inhale 2 puffs into the lungs every 6 (six) hours as needed for wheezing or shortness of breath.    Dispense:  8 g    Refill:  5    Order Specific Question:   Supervising Provider    Answer:   Neva SeatGREENE, Traye R [[id number]]   ezetimibe (ZETIA) 10 MG  tablet    Sig: Take 1 tablet (10 mg total) by mouth daily.    Dispense:  90 tablet    Refill:  3    Order Specific Question:   Supervising Provider    Answer:   Neva Seat, Lamon R [[id number]]   ALPRAZolam (XANAX) 1 MG tablet    Sig: Take 0.5-1 tablets (0.5-1 mg total) by mouth 2 (two) times daily as needed for anxiety.    Dispense:  90 tablet    Refill:  1    Order Specific Question:   Supervising Provider    Answer:   Neva Seat, Seymour R [[id number]]   tamsulosin (FLOMAX) 0.4 MG CAPS capsule    Sig: Take 1 capsule (0.4 mg total) by mouth daily.    Dispense:  90 capsule    Refill:  3    Order Specific Question:   Supervising Provider    Answer:   Neva Seat, Ash R [[id number]]    Follow-up: Return if symptoms worsen or fail to improve.   PLAN Doxycycline 100mg  po bid x 10 days, prednisone burst, flonase 1-2 times daily, continue antihistamines Suggest tylenol, sinus saline rinse,  rest, hydration for relief Refill meds as above Refer to ENT Call if worsening or failing to improve Patient encouraged to call clinic with any questions, comments, or concerns.  , NP

## 2021-08-12 NOTE — Addendum Note (Signed)
Addended by: Rudi Heap on: 08/12/2021 09:40 AM   Modules accepted: Orders

## 2021-08-12 NOTE — Patient Instructions (Signed)
Mr. Zangara -   [name] to see you  Doxycycline 100mg  twice daily for 10 days. This is antibiotic Prednisone 20mg  once daily for 5 days - steroid Flonase 1-2 times daily in each nostril - this will break up congestion  Other meds refilled  Referral sent to Dr.  Call if not improving within one week  Thanks,  

## 2021-10-26 ENCOUNTER — Other Ambulatory Visit: Payer: Self-pay | Admitting: Registered Nurse

## 2021-10-26 DIAGNOSIS — G44319 Acute post-traumatic headache, not intractable: Secondary | ICD-10-CM

## 2021-10-26 NOTE — Telephone Encounter (Signed)
Patient is requesting a refill of the following medications: ?Requested Prescriptions  ? ?Pending Prescriptions Disp Refills  ? amitriptyline (ELAVIL) 10 MG tablet [Pharmacy Med Name: AMITRIPTYLINE HCL 10 MG TAB] 90 tablet 0  ?  Sig: TAKE ONE TABLET BY MOUTH AT BEDTIME  ? ? ?Date of patient request: 10/26/21 ?Last office visit: 08/12/21 ?Date of last refill: 07/29/21 ?Last refill amount: 90 ? ?

## 2022-01-13 ENCOUNTER — Ambulatory Visit (INDEPENDENT_AMBULATORY_CARE_PROVIDER_SITE_OTHER): Payer: 59 | Admitting: Registered Nurse

## 2022-01-13 VITALS — BP 126/64 | HR 78 | Temp 98.3°F | Resp 18 | Ht 69.0 in | Wt 193.4 lb

## 2022-01-13 DIAGNOSIS — F411 Generalized anxiety disorder: Secondary | ICD-10-CM | POA: Diagnosis not present

## 2022-01-13 DIAGNOSIS — R062 Wheezing: Secondary | ICD-10-CM | POA: Diagnosis not present

## 2022-01-13 DIAGNOSIS — M79641 Pain in right hand: Secondary | ICD-10-CM | POA: Insufficient documentation

## 2022-01-13 DIAGNOSIS — E782 Mixed hyperlipidemia: Secondary | ICD-10-CM | POA: Diagnosis not present

## 2022-01-13 DIAGNOSIS — R350 Frequency of micturition: Secondary | ICD-10-CM | POA: Insufficient documentation

## 2022-01-13 DIAGNOSIS — M79642 Pain in left hand: Secondary | ICD-10-CM

## 2022-01-13 MED ORDER — VENLAFAXINE HCL ER 37.5 MG PO CP24: 37.5000 mg | ORAL_CAPSULE | Freq: Every day | ORAL | 1 refills | Status: DC

## 2022-01-13 MED ORDER — DICLOFENAC SODIUM 75 MG PO TBEC: 75.0000 mg | DELAYED_RELEASE_TABLET | Freq: Two times a day (BID) | ORAL | 0 refills | Status: DC

## 2022-01-13 MED ORDER — EZETIMIBE 10 MG PO TABS: 10.0000 mg | ORAL_TABLET | Freq: Every day | ORAL | 3 refills | Status: DC

## 2022-01-13 MED ORDER — ALPRAZOLAM 1 MG PO TABS: 1.0000 mg | ORAL_TABLET | Freq: Two times a day (BID) | ORAL | 1 refills | Status: DC | PRN

## 2022-01-13 MED ORDER — ALBUTEROL SULFATE HFA 108 (90 BASE) MCG/ACT IN AERS: 2.0000 | INHALATION_SPRAY | Freq: Four times a day (QID) | RESPIRATORY_TRACT | 5 refills | Status: DC | PRN

## 2022-01-13 MED ORDER — TAMSULOSIN HCL 0.4 MG PO CAPS: 0.4000 mg | ORAL_CAPSULE | Freq: Every day | ORAL | 3 refills | Status: DC

## 2022-01-13 NOTE — Progress Notes (Signed)
Established Patient Office Visit  Subjective:  Patient ID: Adrian Ayers, male    DOB: Apr 27, 1961  Age: 61 y.o. MRN: [id number]  CC:  Chief Complaint  Patient presents with   Medication Refill    Patient states he is here for medication refill.and some pain management for pain in his right hand    HPI Adrian Ayers presents for Med refill, follow up, pain  Ear Pain ENT suspects TMJ largest contributor.  He is going to see about TMJ specialist/PT Cost may be a factor here.   Pain R hand. Aching. Mostly CMC joints. Worst at middle digit Worse with activity. Advil helps somewhat - generally tolerates NSAIDs well except one instance with piroxicam.  He has not had any acute injury or trauma to the hand in the past.  He has not been seen for this previously.   Refill Wheezing - worse with allergies. Managed well with albuterol. Does not need this daily.  HLD - stable on zetia.  Urinary frequency - improved with flomax. Wants to continue. No hematuria, starting/stopping of stream  Outpatient Medications Prior to Visit  Medication Sig Dispense Refill   amitriptyline (ELAVIL) 10 MG tablet TAKE ONE TABLET BY MOUTH AT BEDTIME 90 tablet 0   doxycycline (VIBRA-TABS) 100 MG tablet Take 1 tablet (100 mg total) by mouth 2 (two) times daily. 20 tablet 0   omeprazole (PRILOSEC OTC) 20 MG tablet Take 20 mg by mouth daily before breakfast.     predniSONE (DELTASONE) 20 MG tablet Take 1 tablet (20 mg total) by mouth daily with breakfast. 5 tablet 0   albuterol (VENTOLIN HFA) 108 (90 Base) MCG/ACT inhaler Inhale 2 puffs into the lungs every 6 (six) hours as needed for wheezing or shortness of breath. 8 g 5   ALPRAZolam (XANAX) 1 MG tablet Take 0.5-1 tablets (0.5-1 mg total) by mouth 2 (two) times daily as needed for anxiety. 90 tablet 1   ezetimibe (ZETIA) 10 MG tablet Take 1 tablet (10 mg total) by mouth daily. 90 tablet 3   tamsulosin (FLOMAX) 0.4 MG CAPS capsule Take 1 capsule (0.4 mg  total) by mouth daily. 90 capsule 3   venlafaxine XR (EFFEXOR XR) 37.5 MG 24 hr capsule Take 1 capsule (37.5 mg total) by mouth daily with breakfast. 90 capsule 1   No facility-administered medications prior to visit.    Review of Systems  Constitutional: Negative.   HENT: Negative.    Eyes: Negative.   Respiratory: Negative.    Cardiovascular: Negative.   Gastrointestinal: Negative.   Endocrine: Negative.   Genitourinary: Negative.   Musculoskeletal:  Positive for arthralgias. Negative for back pain, gait problem, joint swelling, myalgias, neck pain and neck stiffness.  Skin: Negative.   Allergic/Immunologic: Negative.   Neurological: Negative.   Hematological: Negative.   Psychiatric/Behavioral: Negative.    All other systems reviewed and are negative.     Objective:     BP 126/64   Pulse 78   Temp 98.3 F (36.8 C) (Temporal)   Resp 18   Ht 5\' 9"  (1.753 m)   Wt 193 lb 6.4 oz (87.7 kg)   SpO2 98%   BMI 28.56 kg/m   Wt Readings from Last 3 Encounters:  01/13/22 193 lb 6.4 oz (87.7 kg)  08/12/21 198 lb 4.8 oz (89.9 kg)  03/18/21 191 lb 9.6 oz (86.9 kg)   Physical Exam Constitutional:      General: He is not in acute distress.    Appearance:  Normal appearance. He is normal weight. He is not ill-appearing, toxic-appearing or diaphoretic.  Cardiovascular:     Rate and Rhythm: Normal rate and regular rhythm.     Heart sounds: Normal heart sounds. No murmur heard.    No friction rub. No gallop.  Pulmonary:     Effort: Pulmonary effort is normal. No respiratory distress.     Breath sounds: Normal breath sounds. No stridor. No wheezing, rhonchi or rales.  Chest:     Chest wall: No tenderness.  Musculoskeletal:        General: Swelling (mild around [facility] joint of middle finger of R hand) and deformity (firm mass proximal to ventral aspect of R middle CMC joint, suspicious for ganglion cyst.) present. Normal range of motion.     Right lower leg: No edema.     Comments:  Bilat upper extremities including grip strength 5/5  Skin:    General: Skin is warm and dry.     Capillary Refill: Capillary refill takes less than 2 seconds.  Neurological:     General: No focal deficit present.     Mental Status: He is alert and oriented to person, place, and time. Mental status is at baseline.  Psychiatric:        Mood and Affect: Mood normal.        Behavior: Behavior normal.        Thought Content: Thought content normal.        Judgment: Judgment normal.     No results found for any visits on 01/13/22.    The 10-year ASCVD risk score (Arnett DK, et al., 2019) is: 11.8%    Assessment & Plan:   Problem List Items Addressed This Visit       Other   GAD (generalized anxiety disorder)    Stable with venlafaxine and prn alprazolam. Acknowledges risks of benzos. Does not use every day. pdmp consulted, no concerns       Relevant Medications   venlafaxine XR (EFFEXOR XR) 37.5 MG 24 hr capsule   ALPRAZolam (XANAX) 1 MG tablet   Wheezing    Stable with prn albuterol. No Ae. Hopes to continue.       Relevant Medications   albuterol (VENTOLIN HFA) 108 (90 Base) MCG/ACT inhaler   Bilateral hand pain - Primary    Likely some OA, tendonitis, and seemed to be a ganglion cyst on exam. Pt declines referral to ortho/hand specialist at this time. Advised RICE method. Will send course of diclofenac as pt reports consistent good response to NSAIDs except for one instance with piroxicam complicated by a number of other ongoing events at that time. Additionally, he has used topical diclofenac consistently in the past without any AE.       Relevant Medications   diclofenac (VOLTAREN) 75 MG EC tablet   Mixed hyperlipidemia    Continue zetia. Recheck at CPE       Relevant Medications   ezetimibe (ZETIA) 10 MG tablet   Frequent urination    Continue flomax.      Relevant Medications   tamsulosin (FLOMAX) 0.4 MG CAPS capsule    Meds ordered this encounter   Medications   diclofenac (VOLTAREN) 75 MG EC tablet    Sig: Take 1 tablet (75 mg total) by mouth 2 (two) times daily.    Dispense:  30 tablet    Refill:  0    Order Specific Question:   Supervising Provider    Answer:   Shade Flood [  [id number]]   venlafaxine XR (EFFEXOR XR) 37.5 MG 24 hr capsule    Sig: Take 1 capsule (37.5 mg total) by mouth daily with breakfast.    Dispense:  90 capsule    Refill:  1    Order Specific Question:   Supervising Provider    Answer:   Neva Seat, Shafiq R [[id number]]   albuterol (VENTOLIN HFA) 108 (90 Base) MCG/ACT inhaler    Sig: Inhale 2 puffs into the lungs every 6 (six) hours as needed for wheezing or shortness of breath.    Dispense:  8 g    Refill:  5    Order Specific Question:   Supervising Provider    Answer:   Neva Seat, Ammaar R [[id number]]   ezetimibe (ZETIA) 10 MG tablet    Sig: Take 1 tablet (10 mg total) by mouth daily.    Dispense:  90 tablet    Refill:  3    Order Specific Question:   Supervising Provider    Answer:   Neva Seat, Toni R [[id number]]   tamsulosin (FLOMAX) 0.4 MG CAPS capsule    Sig: Take 1 capsule (0.4 mg total) by mouth daily.    Dispense:  90 capsule    Refill:  3    Order Specific Question:   Supervising Provider    Answer:   Neva Seat, Gaberiel R [[id number]]   ALPRAZolam (XANAX) 1 MG tablet    Sig: Take 1-1.5 tablets (1-1.5 mg total) by mouth 2 (two) times daily as needed for anxiety.    Dispense:  135 tablet    Refill:  1    Order Specific Question:   Supervising Provider    Answer:   Neva Seat, Domingo R [[id number]]    No follow-ups on file.    [name], NP

## 2022-01-13 NOTE — Patient Instructions (Signed)
° ° ° °  If you have lab work done today you will be contacted with your lab results within the next 2 weeks.  If you have not heard from us then please contact us. The fastest way to get your results is to register for My Chart. ° ° °IF you received an x-ray today, you will receive an invoice from [facility]. Please contact [facility] at [phone] with questions or concerns regarding your invoice.  ° °IF you received labwork today, you will receive an invoice from [facility]. Please contact [facility] at [phone] with questions or concerns regarding your invoice.  ° °Our billing staff will not be able to assist you with questions regarding bills from these companies. ° °You will be contacted with the lab results as soon as they are available. The fastest way to get your results is to activate your My Chart account. Instructions are located on the last page of this paperwork. If you have not heard from us regarding the results in 2 weeks, please contact this office. °  ° ° ° °

## 2022-01-13 NOTE — Assessment & Plan Note (Signed)
Stable with venlafaxine and prn alprazolam. Acknowledges risks of benzos. Does not use every day. pdmp consulted, no concerns

## 2022-01-13 NOTE — Assessment & Plan Note (Signed)
Stable with prn albuterol. No Ae. Hopes to continue.

## 2022-01-13 NOTE — Assessment & Plan Note (Signed)
Likely some OA, tendonitis, and seemed to be a ganglion cyst on exam. Pt declines referral to ortho/hand specialist at this time. Advised RICE method. Will send course of diclofenac as pt reports consistent good response to NSAIDs except for one instance with piroxicam complicated by a number of other ongoing events at that time. Additionally, he has used topical diclofenac consistently in the past without any AE.

## 2022-01-13 NOTE — Assessment & Plan Note (Signed)
Continue zetia. Recheck at CPE

## 2022-01-13 NOTE — Assessment & Plan Note (Signed)
Continue flomax  

## 2022-01-21 ENCOUNTER — Other Ambulatory Visit: Payer: Self-pay | Admitting: Registered Nurse

## 2022-01-21 DIAGNOSIS — G44319 Acute post-traumatic headache, not intractable: Secondary | ICD-10-CM

## 2022-02-03 ENCOUNTER — Ambulatory Visit (INDEPENDENT_AMBULATORY_CARE_PROVIDER_SITE_OTHER): Payer: 59 | Admitting: Registered Nurse

## 2022-02-03 ENCOUNTER — Telehealth: Payer: Self-pay | Admitting: Registered Nurse

## 2022-02-03 ENCOUNTER — Other Ambulatory Visit: Payer: Self-pay

## 2022-02-03 ENCOUNTER — Encounter: Payer: Self-pay | Admitting: Registered Nurse

## 2022-02-03 VITALS — BP 128/68 | HR 80 | Temp 98.2°F | Resp 18 | Ht 69.0 in | Wt 194.0 lb

## 2022-02-03 DIAGNOSIS — M25561 Pain in right knee: Secondary | ICD-10-CM

## 2022-02-03 MED ORDER — OXYCODONE-ACETAMINOPHEN 5-325 MG PO TABS: 1.0000 | ORAL_TABLET | ORAL | 0 refills | Status: AC | PRN

## 2022-02-03 NOTE — Progress Notes (Signed)
Established Patient Office Visit  Subjective:  Patient ID: Adrian Ayers, male    DOB: [birth year]  Age: 61 y.o. MRN: [id number]  CC:  Chief Complaint  Patient presents with   Knee Pain    Patient states he has been having some left knee pain after twisting it. Patient states feels like his leg is going to go weak.    HPI Adrian Ayers presents for knee pain   He had a twisting injury last week.  Occurred on Wednesday of last week. Hurt worse the next day. Feels unstable Pain is medial joint line. Tender to touch. No radiation into foot or ankle  Cannot cross legs comfortably.  He is able to work a bit but he has severe pain by the end of the day.   Outpatient Medications Prior to Visit  Medication Sig Dispense Refill   albuterol (VENTOLIN HFA) 108 (90 Base) MCG/ACT inhaler Inhale 2 puffs into the lungs every 6 (six) hours as needed for wheezing or shortness of breath. 8 g 5   ALPRAZolam (XANAX) 1 MG tablet Take 1-1.5 tablets (1-1.5 mg total) by mouth 2 (two) times daily as needed for anxiety. 135 tablet 1   amitriptyline (ELAVIL) 10 MG tablet TAKE ONE TABLET BY MOUTH AT BEDTIME 90 tablet 0   diclofenac (VOLTAREN) 75 MG EC tablet Take 1 tablet (75 mg total) by mouth 2 (two) times daily. 30 tablet 0   doxycycline (VIBRA-TABS) 100 MG tablet Take 1 tablet (100 mg total) by mouth 2 (two) times daily. 20 tablet 0   ezetimibe (ZETIA) 10 MG tablet Take 1 tablet (10 mg total) by mouth daily. 90 tablet 3   omeprazole (PRILOSEC OTC) 20 MG tablet Take 20 mg by mouth daily before breakfast.     predniSONE (DELTASONE) 20 MG tablet Take 1 tablet (20 mg total) by mouth daily with breakfast. 5 tablet 0   tamsulosin (FLOMAX) 0.4 MG CAPS capsule Take 1 capsule (0.4 mg total) by mouth daily. 90 capsule 3   venlafaxine XR (EFFEXOR XR) 37.5 MG 24 hr capsule Take 1 capsule (37.5 mg total) by mouth daily with breakfast. 90 capsule 1   No facility-administered medications prior to visit.     Review of Systems  Constitutional: Negative.   HENT: Negative.    Eyes: Negative.   Respiratory: Negative.    Cardiovascular: Negative.   Gastrointestinal: Negative.   Genitourinary: Negative.   Musculoskeletal: Negative.   Skin: Negative.   Neurological: Negative.   Psychiatric/Behavioral: Negative.    All other systems reviewed and are negative.     Objective:     BP 128/68   Pulse 80   Temp 98.2 F (36.8 C) (Temporal)   Resp 18   Ht 5\' 9"  (1.753 m)   Wt 194 lb (88 kg)   SpO2 99%   BMI 28.65 kg/m   Wt Readings from Last 3 Encounters:  02/03/22 194 lb (88 kg)  01/13/22 193 lb 6.4 oz (87.7 kg)  08/12/21 198 lb 4.8 oz (89.9 kg)   Physical Exam Constitutional:      General: He is not in acute distress.    Appearance: Normal appearance. He is normal weight. He is not ill-appearing, toxic-appearing or diaphoretic.  Cardiovascular:     Rate and Rhythm: Normal rate and regular rhythm.     Heart sounds: Normal heart sounds. No murmur heard.    No friction rub. No gallop.  Pulmonary:     Effort: Pulmonary effort is normal.  No respiratory distress.     Breath sounds: Normal breath sounds. No stridor. No wheezing, rhonchi or rales.  Chest:     Chest wall: No tenderness.  Musculoskeletal:        General: Swelling and tenderness present. No deformity or signs of injury. Normal range of motion.     Right lower leg: No edema.     Left lower leg: No edema.     Comments: Medial swelling and tenderness at joint line Negative anterior and posterior drawer Positive McMurrays   Neurological:     General: No focal deficit present.     Mental Status: He is alert and oriented to person, place, and time. Mental status is at baseline.  Psychiatric:        Mood and Affect: Mood normal.        Behavior: Behavior normal.        Thought Content: Thought content normal.        Judgment: Judgment normal.     No results found for any visits on 02/03/22.    The 10-year  ASCVD risk score (Arnett DK, et al., 2019) is: 12.1%    Assessment & Plan:   Problem List Items Addressed This Visit   None Visit Diagnoses     Acute pain of right knee    -  Primary   Relevant Medications   oxyCODONE-acetaminophen (PERCOCET/ROXICET) 5-325 MG tablet   Other Relevant Orders   DG Knee Complete 4 Views Left       Meds ordered this encounter  Medications   oxyCODONE-acetaminophen (PERCOCET/ROXICET) 5-325 MG tablet    Sig: Take 1 tablet by mouth every 4 (four) hours as needed for up to 5 days for severe pain.    Dispense:  30 tablet    Refill:  0    Order Specific Question:   Supervising Provider    Answer:   Neva Seat, Tahsin R [[id number]]    Return if symptoms worsen or fail to improve.   PLAN Sprain vs meniscal injury. Will obtain xray to rule out fx. Oxycodone-acetaminophen for pain relief. Discussed safe use. Patient encouraged to call clinic with any questions, comments, or concerns.    [name], NP

## 2022-02-03 NOTE — Patient Instructions (Addendum)
Mr. Alderman -   [name] to see you  Take pain medication as needed - use with caution.  Xray will be at   Check out these providers as ones I recommend  Jacquiline Doe, MD Edwina Barth, MD Letta Moynahan Early, NP Jiles Prows, DNP Fanny Bien, MD Rodman Pickle, NP Herbie Drape, MD   Thanks, best of luck going forward  Rich   If you have lab work done today you will be contacted with your lab results within the next 2 weeks.  If you have not heard from [place] then please contact us. The fastest way to get your results is to register for My Chart.   IF you received an x-ray today, you will receive an invoice from [facility]. Please contact [facility] at [phone] with questions or concerns regarding your invoice.   IF you received labwork today, you will receive an invoice from [facility]. Please contact [facility] at [phone] with questions or concerns regarding your invoice.   Our billing staff will not be able to assist you with questions regarding bills from these companies.  You will be contacted with the lab results as soon as they are available. The fastest way to get your results is to activate your My Chart account. Instructions are located on the last page of this paperwork. If you have not heard from [place] regarding the results in 2 weeks, please contact this office.

## 2022-02-04 NOTE — Telephone Encounter (Signed)
error 

## 2023-02-02 ENCOUNTER — Encounter: Payer: Self-pay | Admitting: Family Medicine

## 2023-02-02 ENCOUNTER — Ambulatory Visit: Payer: Self-pay | Admitting: Family Medicine

## 2023-02-02 DIAGNOSIS — Z7689 Persons encountering health services in other specified circumstances: Secondary | ICD-10-CM

## 2023-02-02 NOTE — Progress Notes (Addendum)
New Patient Office Visit  Subjective    Patient ID: Adrian Ayers, male    DOB: [birth year]  Age: 62 y.o. MRN: [id number]  CC:  Chief Complaint  Patient presents with   Establish Care    Pt is here today to Est.Care Pt is not FASTING. Pt is requesting refills today    HPI Adrian Ayers presents to establish care with new provider.   Patients previous primary care was Adrian Agee, NP. Last visit was 02/03/2022.  Specialist: GI- [facility]-Dr. Rochel Brome (colonoscopy)  GAD: Chronic. Patient is prescribed Alprazolam 1mg , take 1-1.5mg  BID as needed. He reports he takes it Alprazolam 1-2 times a week. He reports he last took medication last night.   Wheezing: Patient is prescribed Albuterol Inhaler as needed for wheezing. Last chest x-ray was negative for active cardiopulmonary disease on 08/26/2017. He reports he takes it 1-2 twice a week.   Hyperlipidemia: Chronic. Patient is prescribed Ezetimibe 10mg  daily. Effective.   Frequent urination: Chronic. Patient is prescribed Tamsulosin 0.4mg  daily. Effective.   GERD: Chronic. Patient is taking either Esomeprazole 20mg  daily or Omeprazole 20mg  daily. He reports he does not take them together and these are effective.   Outpatient Encounter Medications as of 02/03/2023  Medication Sig   albuterol (VENTOLIN HFA) 108 (90 Base) MCG/ACT inhaler Inhale 2 puffs into the lungs every 6 (six) hours as needed for wheezing or shortness of breath.   diclofenac Sodium (VOLTAREN ARTHRITIS PAIN) 1 % GEL Apply topically 4 (four) times daily.   esomeprazole (NEXIUM) 20 MG packet Take 20 mg by mouth daily before breakfast.   omeprazole (PRILOSEC OTC) 20 MG tablet Take 20 mg by mouth daily before breakfast.   [DISCONTINUED] ALPRAZolam (XANAX) 1 MG tablet Take 1-1.5 tablets (1-1.5 mg total) by mouth 2 (two) times daily as needed for anxiety.   [DISCONTINUED] doxycycline (VIBRA-TABS) 100 MG tablet Take 1 tablet (100 mg total) by mouth 2  (two) times daily.   [DISCONTINUED] ezetimibe (ZETIA) 10 MG tablet Take 1 tablet (10 mg total) by mouth daily.   [DISCONTINUED] predniSONE (DELTASONE) 20 MG tablet Take 1 tablet (20 mg total) by mouth daily with breakfast.   [DISCONTINUED] tamsulosin (FLOMAX) 0.4 MG CAPS capsule Take 1 capsule (0.4 mg total) by mouth daily.   ALPRAZolam (XANAX) 1 MG tablet Take 1-1.5 tablets (1-1.5 mg total) by mouth 2 (two) times daily as needed for anxiety.   ezetimibe (ZETIA) 10 MG tablet Take 1 tablet (10 mg total) by mouth daily.   tamsulosin (FLOMAX) 0.4 MG CAPS capsule Take 1 capsule (0.4 mg total) by mouth daily.   [DISCONTINUED] amitriptyline (ELAVIL) 10 MG tablet TAKE ONE TABLET BY MOUTH AT BEDTIME   [DISCONTINUED] diclofenac (VOLTAREN) 75 MG EC tablet Take 1 tablet (75 mg total) by mouth 2 (two) times daily.   [DISCONTINUED] venlafaxine XR (EFFEXOR XR) 37.5 MG 24 hr capsule Take 1 capsule (37.5 mg total) by mouth daily with breakfast.   No facility-administered encounter medications on file as of 02/03/2023.    Past Medical History:  Diagnosis Date   GAD (generalized anxiety disorder)    GERD (gastroesophageal reflux disease)    Hyperlipidemia    Measles    Multiple allergies     History reviewed. No pertinent surgical history.  Family History  Problem Relation Age of Onset   Lung cancer Father        smoked   Asthma Maternal Grandfather    Heart disease Maternal Grandfather  Social History   Socioeconomic History   Marital status: Married    Spouse name: Not on file   Number of children: 2   Years of education: Not on file   Highest education level: 8th grade  Occupational History   Occupation: heating and air business x 30 yrs  Tobacco Use   Smoking status: Former    Current packs/day: 1.00    Average packs/day: 1 pack/day for 32.2 years (32.2 ttl pk-yrs)    Types: Cigarettes    Start date: 11/17/1990   Smokeless tobacco: Never  Vaping Use   Vaping status: Never Used   Substance and Sexual Activity   Alcohol use: Not Currently   Drug use: No   Sexual activity: Yes  Other Topics Concern   Not on file  Social History Narrative   Not on file   Social Determinants of Health   Financial Resource Strain: Low Risk  (02/03/2023)   Overall Financial Resource Strain (CARDIA)    Difficulty of Paying Living Expenses: Not hard at all  Food Insecurity: No Food Insecurity (02/03/2023)   Hunger Vital Sign    Worried About Running Out of Food in the Last Year: Never true    Ran Out of Food in the Last Year: Never true  Transportation Needs: No Transportation Needs (02/03/2023)   PRAPARE - Administrator, Civil Service (Medical): No    Lack of Transportation (Non-Medical): No  Physical Activity: Sufficiently Active (02/03/2023)   Exercise Vital Sign    Days of Exercise per Week: 7 days    Minutes of Exercise per Session: 30 min  Stress: No Stress Concern Present (02/03/2023)   [employer] of Occupational Health - Occupational Stress Questionnaire    Feeling of Stress : Only a little  Social Connections: Moderately Isolated (02/03/2023)   Social Connection and Isolation Panel [NHANES]    Frequency of Communication with Friends and Family: Twice a week    Frequency of Social Gatherings with Friends and Family: Twice a week    Attends Religious Services: Never    Database administrator or Organizations: No    Attends Banker Meetings: Never    Marital Status: Married  Catering manager Violence: Not At Risk (02/03/2023)   Humiliation, Afraid, Rape, and Kick questionnaire    Fear of Current or Ex-Partner: No    Emotionally Abused: No    Physically Abused: No    Sexually Abused: No    ROS See HPI above    Objective    BP 120/74   Pulse 64   Temp 97.9 F (36.6 C)   Ht 5\' 9"  (1.753 m)   Wt 192 lb 6 oz (87.3 kg)   SpO2 98%   BMI 28.41 kg/m   Physical Exam Vitals reviewed.  Constitutional:      General: He is not in acute  distress.    Appearance: Normal appearance. He is not ill-appearing, toxic-appearing or diaphoretic.  HENT:     Head: Normocephalic and atraumatic.  Eyes:     General:        Right eye: No discharge.        Left eye: No discharge.     Conjunctiva/sclera: Conjunctivae normal.     Comments: Wearing glasses  Cardiovascular:     Rate and Rhythm: Normal rate and regular rhythm.     Heart sounds: Normal heart sounds. No murmur heard.    No friction rub. No gallop.  Pulmonary:  Effort: Pulmonary effort is normal. No respiratory distress.     Breath sounds: Normal breath sounds.  Musculoskeletal:        General: Normal range of motion.  Skin:    General: Skin is warm and dry.  Neurological:     General: No focal deficit present.     Mental Status: He is alert and oriented to person, place, and time. Mental status is at baseline.  Psychiatric:        Mood and Affect: Mood normal.        Behavior: Behavior normal.        Thought Content: Thought content normal.        Judgment: Judgment normal.     Latest Reference Range & Units 02/03/23 08:38  POC Amphetamine UR None Detected  None Detected  POC Cocaine UR None Detected  None Detected  POC Methamphetamine UR None Detected  None Detected  POC Ecstasy UR None Detected  None Detected  POC Oxycodone UR None Detected  None Detected  POC Methadone UR None Detected  None Detected  POC Marijuana UR None Detected  None Detected  POC Opiate Ur None Detected  None Detected  POC PHENCYCLIDINE UR None Detected  None Detected  POC TRICYCLICS UR None Detected  None Detected  POC Barbiturate UR None Detected  None Detected  POC BENZODIAZEPINES UR None Detected  Positive !  URINE TEMPERATURE 90.0 - 100.0 Degrees F 96.0  !: Data is abnormal   Assessment & Plan:  GAD (generalized anxiety disorder) Assessment & Plan: Urine drug screen completed today and will have to be completed periodically for taking Alprazolam. Also, patient and provider  signed a controlled substance contract. This will need to be renewed yearly. Patient was provided a copy of contract. Refilled Alprazolam.   Orders: -     POCT Urine drug screen -     ALPRAZolam; Take 1-1.5 tablets (1-1.5 mg total) by mouth 2 (two) times daily as needed for anxiety.  Dispense: 135 tablet; Refill: 0  Wheezing Assessment & Plan: Continue Albuterol Inhaler as needed for wheezing. Lungs are clear on physical exam.    Mixed hyperlipidemia Assessment & Plan: Continue Ezetimibe 10mg  daily. He will need to have lipid panel checked at next appointment in 6 months.   Orders: -     Ezetimibe; Take 1 tablet (10 mg total) by mouth daily.  Dispense: 90 tablet; Refill: 1  Frequent urination Assessment & Plan: Continue with Tamsulosin 0.4mg  daily since it is effective.   Orders: -     Tamsulosin HCl; Take 1 capsule (0.4 mg total) by mouth daily.  Dispense: 90 capsule; Refill: 1  Gastroesophageal reflux disease without esophagitis Assessment & Plan: Recommend to continue either Esomeprazole 20mg  or Omeprazole 20mg . He is not taking medication together. Effective.    Encounter to establish care  1.Review health maintenance:  -Covid vaccine/booster: initial 2 covid and 1 booster, declines additional boosters -Zoster vaccine-discussed; he may obtain at local pharmacy  2.Patient does not have insurance and request to have labs at next visit. He reports he will have insurance at his next appointment. Patient will need all labs at his next visit.   Return in about 6 months (around 08/06/2023) for chronic management.   Zandra Abts, NP

## 2023-02-03 ENCOUNTER — Ambulatory Visit (INDEPENDENT_AMBULATORY_CARE_PROVIDER_SITE_OTHER): Payer: Commercial Managed Care - HMO | Admitting: Family Medicine

## 2023-02-03 ENCOUNTER — Encounter: Payer: Self-pay | Admitting: Family Medicine

## 2023-02-03 VITALS — BP 120/74 | HR 64 | Temp 97.9°F | Ht 69.0 in | Wt 192.4 lb

## 2023-02-03 DIAGNOSIS — F411 Generalized anxiety disorder: Secondary | ICD-10-CM | POA: Diagnosis not present

## 2023-02-03 DIAGNOSIS — R062 Wheezing: Secondary | ICD-10-CM | POA: Diagnosis not present

## 2023-02-03 DIAGNOSIS — R7303 Prediabetes: Secondary | ICD-10-CM

## 2023-02-03 DIAGNOSIS — E782 Mixed hyperlipidemia: Secondary | ICD-10-CM | POA: Diagnosis not present

## 2023-02-03 DIAGNOSIS — Z130 Encounter for screening for diseases of the blood and blood-forming organs and certain disorders involving the immune mechanism: Secondary | ICD-10-CM

## 2023-02-03 DIAGNOSIS — K219 Gastro-esophageal reflux disease without esophagitis: Secondary | ICD-10-CM | POA: Insufficient documentation

## 2023-02-03 DIAGNOSIS — R350 Frequency of micturition: Secondary | ICD-10-CM | POA: Diagnosis not present

## 2023-02-03 DIAGNOSIS — Z7689 Persons encountering health services in other specified circumstances: Secondary | ICD-10-CM

## 2023-02-03 LAB — POCT URINE DRUG SCREEN
POC Amphetamine UR: NOT DETECTED
POC BENZODIAZEPINES UR: POSITIVE — AB
POC Barbiturate UR: NOT DETECTED
POC Cocaine UR: NOT DETECTED
POC Ecstasy UR: NOT DETECTED
POC Marijuana UR: NOT DETECTED
POC Methadone UR: NOT DETECTED
POC Methamphetamine UR: NOT DETECTED
POC Opiate Ur: NOT DETECTED
POC Oxycodone UR: NOT DETECTED
POC PHENCYCLIDINE UR: NOT DETECTED
POC TRICYCLICS UR: NOT DETECTED
URINE TEMPERATURE: 96 Degrees F (ref 90.0–100.0)

## 2023-02-03 MED ORDER — TAMSULOSIN HCL 0.4 MG PO CAPS: 0.4000 mg | ORAL_CAPSULE | Freq: Every day | ORAL | 1 refills | Status: DC

## 2023-02-03 MED ORDER — ALPRAZOLAM 1 MG PO TABS: 1.0000 mg | ORAL_TABLET | Freq: Two times a day (BID) | ORAL | 0 refills | Status: DC | PRN

## 2023-02-03 MED ORDER — EZETIMIBE 10 MG PO TABS: 10.0000 mg | ORAL_TABLET | Freq: Every day | ORAL | 1 refills | Status: DC

## 2023-02-03 NOTE — Patient Instructions (Signed)
-  It was a pleasure to meet you and look forward to taking care of you. -Urine drug screen completed today and will have to be completed periodically for taking Alprazolam. Also, you and provider signed a controlled substance contract. This will need to be renewed yearly. You were provided a copy of contract. -Refilled several medications. Continue all prescribed medications.  -On next visit, you will need to have labs drawn to keep prescribing medication safely.  -Follow up in 6 months.

## 2023-02-03 NOTE — Assessment & Plan Note (Signed)
Continue Ezetimibe 10mg  daily. He will need to have lipid panel checked at next appointment in 6 months.

## 2023-02-03 NOTE — Assessment & Plan Note (Signed)
Urine drug screen completed today and will have to be completed periodically for taking Alprazolam. Also, patient and provider signed a controlled substance contract. This will need to be renewed yearly. Patient was provided a copy of contract. Refilled Alprazolam.

## 2023-02-03 NOTE — Assessment & Plan Note (Signed)
Recommend to continue either Esomeprazole 20mg  or Omeprazole 20mg . He is not taking medication together. Effective.

## 2023-02-03 NOTE — Assessment & Plan Note (Signed)
Continue with Tamsulosin 0.4mg  daily since it is effective.

## 2023-02-03 NOTE — Assessment & Plan Note (Signed)
Continue Albuterol Inhaler as needed for wheezing. Lungs are clear on physical exam.

## 2023-06-20 ENCOUNTER — Other Ambulatory Visit: Payer: Self-pay | Admitting: Family Medicine

## 2023-06-20 DIAGNOSIS — F411 Generalized anxiety disorder: Secondary | ICD-10-CM

## 2023-06-21 NOTE — Telephone Encounter (Signed)
Requested Prescriptions   Pending Prescriptions Disp Refills   ALPRAZolam (XANAX) 1 MG tablet 135 tablet 0    Sig: Take 1-1.5 tablets (1-1.5 mg total) by mouth 2 (two) times daily as needed for anxiety.     Date of patient request: 06/21/2023 Last office visit: 02/03/2023 Upcoming visit: Visit date not found Date of last refill: 08/08/2023 Last refill amount:#135

## 2023-06-29 ENCOUNTER — Other Ambulatory Visit: Payer: Self-pay | Admitting: Family Medicine

## 2023-06-29 DIAGNOSIS — F411 Generalized anxiety disorder: Secondary | ICD-10-CM

## 2023-06-29 MED ORDER — ALPRAZOLAM 1 MG PO TABS
1.0000 mg | ORAL_TABLET | Freq: Two times a day (BID) | ORAL | 0 refills | Status: DC | PRN
Start: 2023-06-29 — End: 2024-02-10

## 2023-08-08 ENCOUNTER — Ambulatory Visit: Payer: Self-pay | Admitting: Family Medicine

## 2023-08-10 ENCOUNTER — Other Ambulatory Visit: Payer: Self-pay | Admitting: *Deleted

## 2023-08-10 DIAGNOSIS — E782 Mixed hyperlipidemia: Secondary | ICD-10-CM

## 2023-08-10 MED ORDER — EZETIMIBE 10 MG PO TABS
10.0000 mg | ORAL_TABLET | Freq: Every day | ORAL | 0 refills | Status: DC
Start: 2023-08-10 — End: 2024-02-10

## 2024-01-25 ENCOUNTER — Telehealth: Payer: Self-pay

## 2024-01-25 NOTE — Telephone Encounter (Signed)
 Copied from CRM [id number]. Topic: Appointments - Transfer of Care >> Jan 25, 2024  9:57 AM Henretta I wrote: Pt is requesting to transfer FROM: Philippe JONELLE Slade  Pt is requesting to transfer TO: Cleatus Debby Specking Reason for requested transfer: Patient needed new doctor It is the responsibility of the team the patient would like to transfer to (Dr. Cleatus Debby Specking ) to reach out to the patient if for any reason this transfer is not acceptable.

## 2024-01-25 NOTE — Telephone Encounter (Signed)
 Please schedle pt a Est Care Visit

## 2024-02-10 ENCOUNTER — Ambulatory Visit: Payer: Self-pay | Admitting: Student in an Organized Health Care Education/Training Program

## 2024-02-10 ENCOUNTER — Telehealth: Payer: Self-pay

## 2024-02-10 ENCOUNTER — Encounter: Payer: Self-pay | Admitting: Student in an Organized Health Care Education/Training Program

## 2024-02-10 ENCOUNTER — Other Ambulatory Visit (HOSPITAL_COMMUNITY): Payer: Self-pay

## 2024-02-10 VITALS — BP 131/80 | HR 60 | Ht 69.0 in | Wt 191.0 lb

## 2024-02-10 DIAGNOSIS — N401 Enlarged prostate with lower urinary tract symptoms: Secondary | ICD-10-CM | POA: Diagnosis not present

## 2024-02-10 DIAGNOSIS — N400 Enlarged prostate without lower urinary tract symptoms: Secondary | ICD-10-CM | POA: Insufficient documentation

## 2024-02-10 DIAGNOSIS — F411 Generalized anxiety disorder: Secondary | ICD-10-CM | POA: Diagnosis not present

## 2024-02-10 DIAGNOSIS — R7303 Prediabetes: Secondary | ICD-10-CM | POA: Diagnosis not present

## 2024-02-10 DIAGNOSIS — K219 Gastro-esophageal reflux disease without esophagitis: Secondary | ICD-10-CM

## 2024-02-10 DIAGNOSIS — Z125 Encounter for screening for malignant neoplasm of prostate: Secondary | ICD-10-CM

## 2024-02-10 DIAGNOSIS — E782 Mixed hyperlipidemia: Secondary | ICD-10-CM | POA: Diagnosis not present

## 2024-02-10 LAB — LIPID PANEL
Cholesterol: 241 mg/dL — ABNORMAL HIGH (ref 0–200)
HDL: 41.2 mg/dL (ref >39.00)
LDL Cholesterol: 152 mg/dL — ABNORMAL HIGH (ref 0–99)
NonHDL: 199.63
Total CHOL/HDL Ratio: 6
Triglycerides: 239 mg/dL — ABNORMAL HIGH (ref 0.0–149.0)
VLDL: 47.8 mg/dL — ABNORMAL HIGH (ref 0.0–40.0)

## 2024-02-10 LAB — BASIC METABOLIC PANEL WITH GFR
BUN: 8 mg/dL (ref 6–23)
CO2: 27 meq/L (ref 19–32)
Calcium: 9.8 mg/dL (ref 8.4–10.5)
Chloride: 104 meq/L (ref 96–112)
Creatinine, Ser: 0.77 mg/dL (ref 0.40–1.50)
GFR: 96.46 mL/min (ref >60.00)
Glucose, Bld: 119 mg/dL — ABNORMAL HIGH (ref 70–99)
Potassium: 4.6 meq/L (ref 3.5–5.1)
Sodium: 139 meq/L (ref 135–145)

## 2024-02-10 LAB — HEMOGLOBIN A1C: Hgb A1c MFr Bld: 5.9 % (ref 4.6–6.5)

## 2024-02-10 LAB — PSA: PSA: 0.96 ng/mL (ref 0.10–4.00)

## 2024-02-10 MED ORDER — ALBUTEROL SULFATE HFA 108 (90 BASE) MCG/ACT IN AERS: 2.0000 | INHALATION_SPRAY | Freq: Four times a day (QID) | RESPIRATORY_TRACT | 5 refills | Status: AC | PRN

## 2024-02-10 MED ORDER — ESOMEPRAZOLE MAGNESIUM 20 MG PO PACK
20.0000 mg | PACK | Freq: Every day | ORAL | 3 refills | Status: DC
Start: 2024-02-10 — End: 2024-02-14

## 2024-02-10 MED ORDER — ALPRAZOLAM 1 MG PO TABS
1.0000 mg | ORAL_TABLET | Freq: Every day | ORAL | 0 refills | Status: DC | PRN
Start: 2024-02-10 — End: 2024-03-13

## 2024-02-10 MED ORDER — ROSUVASTATIN CALCIUM 20 MG PO TABS: 20.0000 mg | ORAL_TABLET | Freq: Every day | ORAL | 3 refills | Status: AC

## 2024-02-10 NOTE — Assessment & Plan Note (Signed)
 Chronic issue with a moderate to severe hyperlipidemia.  Intolerant to atorvastatin  because of some muscle symptoms.  Using ezetimibe  for a period.  This is primary prevention of ischemic disease.  Will check lipids today.  I anticipate his 10-year ASCVD risk is well over 10%.  He is open to retrying a statin medication.  Will try rosuvastatin 10 mg daily which may be more tolerable than the atorvastatin .

## 2024-02-10 NOTE — Assessment & Plan Note (Signed)
 He experiences nocturia and discontinued Flomax  due to ineffectiveness. He agreed to PSA screening. Check PSA level for prostate cancer screening.

## 2024-02-10 NOTE — Patient Instructions (Signed)
  VISIT SUMMARY: Today, we discussed your current medications and treatment plans for managing your cholesterol, anxiety, GERD, and nocturia. We also reviewed your general health maintenance and the importance of regular follow-ups.  YOUR PLAN: -HYPERLIPIDEMIA: Hyperlipidemia means having high levels of fats (lipids) in your blood, which can increase your risk of heart disease. We will switch you from ezetimibe  to a statin medication, as statins are more effective in controlling cholesterol levels and reducing cardiovascular risk.  -GASTROESOPHAGEAL REFLUX DISEASE (GERD): GERD is a condition where stomach acid frequently flows back into the tube connecting your mouth and stomach, causing irritation. We will prescribe Nexium 20 mg daily to help manage your symptoms.  -BENIGN PROSTATIC HYPERPLASIA (BPH): BPH is an enlarged prostate gland that can cause urinary problems. Since you experience nocturia and Flomax  was ineffective, we will check your PSA levels to screen for prostate cancer.  -ANXIETY: Anxiety is a feeling of worry or fear that can be intense and affect daily activities. You will continue using Xanax  as needed, with a prescription for 45 tablets. We will monitor your usage and effectiveness every three months.  -GENERAL HEALTH MAINTENANCE: We will conduct blood work to check your cholesterol, blood sugar, and kidney function, as you have not had these tests since July 2022. Regular screenings are important for maintaining your overall health.  INSTRUCTIONS: Please schedule a follow-up appointment in three months. During this visit, we will review your progress with the new statin medication, the effectiveness of Nexium for your GERD, your PSA levels, and your anxiety management. We will also discuss any concerns you have about insurance and continuity of care options.

## 2024-02-10 NOTE — Progress Notes (Signed)
 Established Patient Office Visit  Subjective   Patient ID: Adrian Ayers, male    DOB: 1961-06-08  Age: 63 y.o. MRN: [id number]  Chief Complaint  Patient presents with   Transitions Of Care    Would like to establish care     HPI  Discussed the use of AI scribe software for clinical note transcription with the patient, who gave verbal consent to proceed.  History of Present Illness Adrian Ayers is a 63 year old male who presents for medication management and discussion of cholesterol treatment.  He is currently taking ezetimibe  for cholesterol management, which was prescribed by a previous doctor. He previously tried statin medications but experienced an upset stomach, which he attributes to a combination of medications he was taking at the time.  He uses an inhaler and alprazolam  for anxiety. He has been using alprazolam  for three years, typically taking one milligram tablets as needed, sometimes up to one and a half tablets if anxiety is severe. He has tried other anxiety medications in the past, such as Lexapro , but experienced side effects like headaches and stomach aches. He finds alprazolam  effective in managing his anxiety, which is exacerbated by work-related stress and driving. No side effects such as drowsiness or memory issues from alprazolam .  He experiences gastroesophageal reflux disease (GERD) and uses over-the-counter medications like Prilosec, Nexium, and omeprazole, preferring those with magnesium as they are more effective for him. He attributes his GERD to long-term exposure to fiberglass and work-related stress. He takes one pill daily to manage symptoms, which are controlled unless he skips a dose.  He has a history of knee injury two and a half years ago, which affected his ability to sit and stand without assistance. He reports improvement with time and exercise.  He uses albuterol  inhaler as needed, approximately once or twice a month, primarily during  flare-ups or when experiencing congestion. No smoking and no regular use of the inhaler.  He experiences nocturia, getting up two to three times a night to urinate, and has previously used Flomax  but stopped due to perceived ineffectiveness. He drinks four to six bottles of water daily and tries to limit fluid intake before bed.      Objective:     BP 131/80   Pulse 60   Ht 5' 9 (1.753 m)   Wt 191 lb (86.6 kg)   SpO2 99%   BMI 28.21 kg/m    Physical Exam  Gen: Well-appearing man Heart: Regular, no murmur Lungs: Unlabored, clear throughout Ext: Warm, no edema, normal joints Psych: Appropriate mood and affect, not anxious or depressed appearing    Assessment & Plan:    Problem List Items Addressed This Visit       High   GAD (generalized anxiety disorder) - Primary (Chronic)   He uses Xanax  1 mg as needed effectively, averaging 22 tablets monthly, and prefers his current regimen. Controlled medication prescribing practices were discussed. Prescribe 45 tablets of Xanax  with follow-up in three months. Monitor Xanax  usage and effectiveness every three months.  I reviewed the database which was appropriate.  Previously he was getting 23-month dispensations, I think it is safer to do shorter dispenses every 1-2 months.  He has tried SSRIs in the past but found them ineffective and had side effects to several different ones.  Current benzo comes with risk, but he finds it very effective and currently not having side effects.  Will work on this with him in the coming years  especially as he is getting older.      Relevant Medications   ALPRAZolam  (XANAX ) 1 MG tablet     Medium    Mixed hyperlipidemia (Chronic)   Chronic issue with a moderate to severe hyperlipidemia.  Intolerant to atorvastatin  because of some muscle symptoms.  Using ezetimibe  for a period.  This is primary prevention of ischemic disease.  Will check lipids today.  I anticipate his 10-year ASCVD risk is well over  10%.  He is open to retrying a statin medication.  Will try rosuvastatin 10 mg daily which may be more tolerable than the atorvastatin .      Relevant Medications   rosuvastatin (CRESTOR) 20 MG tablet   Other Relevant Orders   Basic metabolic panel with GFR   Lipid panel   Prediabetes (Chronic)   Relevant Orders   Hemoglobin A1c   BPH (benign prostatic hyperplasia) (Chronic)   He experiences nocturia and discontinued Flomax  due to ineffectiveness. He agreed to PSA screening. Check PSA level for prostate cancer screening.        Low   Gastroesophageal reflux disease without esophagitis (Chronic)   His long-standing GERD is controlled with OTC medications, and he prefers magnesium formulations. He acknowledges the necessity of treatment despite potential long-term effects. Prescribe Nexium 20 mg daily.      Relevant Medications   esomeprazole (NEXIUM) 20 MG packet   Other Visit Diagnoses       Screening for prostate cancer       Relevant Orders   PSA       Return in about 3 months (around 05/12/2024).    Adrian Debby Specking, MD

## 2024-02-10 NOTE — Telephone Encounter (Signed)
 Pharmacy Patient Advocate Encounter   Received notification from CoverMyMeds that prior authorization for Esomeprazole Magnesium 20MG  dr capsules is required/requested.   Insurance verification completed.   The patient is insured through [employer]  .   Per test claim: PA required; PA submitted to above mentioned insurance via CoverMyMeds Key/confirmation #/EOC [id number] Status is pending

## 2024-02-10 NOTE — Assessment & Plan Note (Signed)
 His long-standing GERD is controlled with OTC medications, and he prefers magnesium formulations. He acknowledges the necessity of treatment despite potential long-term effects. Prescribe Nexium 20 mg daily.

## 2024-02-10 NOTE — Assessment & Plan Note (Signed)
 He uses Xanax  1 mg as needed effectively, averaging 22 tablets monthly, and prefers his current regimen. Controlled medication prescribing practices were discussed. Prescribe 45 tablets of Xanax  with follow-up in three months. Monitor Xanax  usage and effectiveness every three months.  I reviewed the database which was appropriate.  Previously he was getting 63-month dispensations, I think it is safer to do shorter dispenses every 1-2 months.  He has tried SSRIs in the past but found them ineffective and had side effects to several different ones.  Current benzo comes with risk, but he finds it very effective and currently not having side effects.  Will work on this with him in the coming years especially as he is getting older.

## 2024-02-13 ENCOUNTER — Other Ambulatory Visit (HOSPITAL_COMMUNITY): Payer: Self-pay

## 2024-02-13 NOTE — Telephone Encounter (Signed)
 Per denial, patient must fail Esomeprazole  capsules.

## 2024-02-13 NOTE — Telephone Encounter (Signed)
 Pharmacy Patient Advocate Encounter  Received notification from [employer] that Prior Authorization for NEXIUM  20 MG packets  has been DENIED.  Full denial letter will be uploaded to the media tab. See denial reason below.   PA #/Case ID/Reference #: [id number]

## 2024-02-13 NOTE — Telephone Encounter (Signed)
 Patient needs to try and fail Esomeprazole  capsules before they will cover Nexium . FYI.

## 2024-02-13 NOTE — Telephone Encounter (Signed)
 What medications similar are covered under patients plan?

## 2024-02-14 ENCOUNTER — Telehealth: Payer: Self-pay

## 2024-02-14 ENCOUNTER — Other Ambulatory Visit (HOSPITAL_COMMUNITY): Payer: Self-pay

## 2024-02-14 MED ORDER — ESOMEPRAZOLE MAGNESIUM 20 MG PO CPDR: 20.0000 mg | DELAYED_RELEASE_CAPSULE | Freq: Every day | ORAL | 1 refills | Status: DC

## 2024-02-14 NOTE — Addendum Note (Signed)
 Addended by: JERRELL SOLIAN T on: 02/14/2024 07:43 AM   Modules accepted: Orders

## 2024-02-14 NOTE — Telephone Encounter (Signed)
Patient returned call and is aware of medication change.

## 2024-02-14 NOTE — Telephone Encounter (Signed)
 Copied from CRM [id number]. Topic: General - Other >> Feb 14, 2024  9:19 AM Macario HERO wrote: Reason for CRM: Patient called back regarding voicemail from [facility], Provided message to patient about prescription.

## 2024-02-14 NOTE — Telephone Encounter (Signed)
 Pharmacy Patient Advocate Encounter   Received notification from CoverMyMeds that prior authorization for Esomeprazole  Magnesium  20MG  dr capsules is required/requested.   Insurance verification completed.   The patient is insured through [employer] .   Per test claim: PA required; PA submitted to above mentioned insurance via CoverMyMeds Key/confirmation #/EOC [facility] Status is pending

## 2024-02-14 NOTE — Telephone Encounter (Signed)
 Rx for esomeprazole  capsule sent.

## 2024-02-14 NOTE — Telephone Encounter (Signed)
 Called and LM to return call so we can let patient know that Nexium  was not covered by insurance. We are going to trial the generic form of this medication and if that is not effective we can submit a new request for the Nexium  with better odds of getting it covered  Medication is waiting at the pharmacy.

## 2024-02-15 ENCOUNTER — Other Ambulatory Visit (HOSPITAL_COMMUNITY): Payer: Self-pay

## 2024-02-15 NOTE — Telephone Encounter (Signed)
 Pharmacy Patient Advocate Encounter  Received notification from [facility] that Prior Authorization for Esomeprazole  20mg  caps has been APPROVED from 02/14/24 to 02/13/25. Ran test claim, Copay is $7.99 for 30 days. This test claim was processed through [facility]- copay amounts may vary at other pharmacies due to pharmacy/plan contracts, or as the patient moves through the different stages of their insurance plan.  Approval letter indexed to media tab

## 2024-02-15 NOTE — Telephone Encounter (Signed)
 Noted and let patient know.

## 2024-03-13 ENCOUNTER — Other Ambulatory Visit: Payer: Self-pay | Admitting: Student in an Organized Health Care Education/Training Program

## 2024-03-13 DIAGNOSIS — F411 Generalized anxiety disorder: Secondary | ICD-10-CM

## 2024-04-04 ENCOUNTER — Encounter: Payer: Self-pay | Admitting: Student in an Organized Health Care Education/Training Program

## 2024-04-04 DIAGNOSIS — F411 Generalized anxiety disorder: Secondary | ICD-10-CM

## 2024-04-24 MED ORDER — ALPRAZOLAM 1 MG PO TABS: 1.0000 mg | ORAL_TABLET | Freq: Every day | ORAL | 0 refills | Status: DC | PRN

## 2024-04-24 NOTE — Addendum Note (Signed)
 Addended by: RANDINE BERYL SAUNDERS on: 04/24/2024 08:33 AM   Modules accepted: Orders

## 2024-04-24 NOTE — Telephone Encounter (Signed)
 Requested Prescriptions   Pending Prescriptions Disp Refills   ALPRAZolam  (XANAX ) 1 MG tablet 30 tablet 0    Sig: Take 1 tablet (1 mg total) by mouth daily as needed. for anxiety     Date of patient request: 04/24/2024 Last office visit: 02/10/2024 Upcoming visit: 05/11/2024 Date of last refill: 03/13/2024 Last refill amount: 30 tab 0 refill

## 2024-05-11 ENCOUNTER — Ambulatory Visit: Admitting: Student in an Organized Health Care Education/Training Program

## 2024-05-11 ENCOUNTER — Encounter: Payer: Self-pay | Admitting: Student in an Organized Health Care Education/Training Program

## 2024-05-11 ENCOUNTER — Telehealth: Payer: Self-pay

## 2024-05-11 VITALS — BP 139/81 | HR 55 | Wt 195.0 lb

## 2024-05-11 DIAGNOSIS — J329 Chronic sinusitis, unspecified: Secondary | ICD-10-CM | POA: Insufficient documentation

## 2024-05-11 DIAGNOSIS — E782 Mixed hyperlipidemia: Secondary | ICD-10-CM

## 2024-05-11 DIAGNOSIS — F411 Generalized anxiety disorder: Secondary | ICD-10-CM

## 2024-05-11 DIAGNOSIS — H60392 Other infective otitis externa, left ear: Secondary | ICD-10-CM

## 2024-05-11 DIAGNOSIS — H6090 Unspecified otitis externa, unspecified ear: Secondary | ICD-10-CM | POA: Insufficient documentation

## 2024-05-11 DIAGNOSIS — J320 Chronic maxillary sinusitis: Secondary | ICD-10-CM | POA: Diagnosis not present

## 2024-05-11 LAB — POCT URINE DRUG SCREEN
Methylenedioxyamphetamine: NOT DETECTED
POC Amphetamine UR: NOT DETECTED
POC BENZODIAZEPINES UR: POSITIVE — AB
POC Barbiturate UR: NOT DETECTED
POC Cocaine UR: NOT DETECTED
POC Ecstasy UR: NOT DETECTED
POC Marijuana UR: NOT DETECTED
POC Methadone UR: NOT DETECTED
POC Methamphetamine UR: NOT DETECTED
POC Opiate Ur: NOT DETECTED
POC Oxycodone UR: NOT DETECTED
POC PHENCYCLIDINE UR: NOT DETECTED
POC TRICYCLICS UR: NOT DETECTED
URINE TEMPERATURE: 96 [degF] (ref 90.0–100.0)

## 2024-05-11 MED ORDER — FLUTICASONE PROPIONATE 50 MCG/ACT NA SUSP: 2.0000 | Freq: Every day | NASAL | 6 refills | Status: DC

## 2024-05-11 MED ORDER — CIPROFLOXACIN-DEXAMETHASONE 0.3-0.1 % OT SUSP: 4.0000 [drp] | Freq: Two times a day (BID) | OTIC | 0 refills | Status: DC

## 2024-05-11 MED ORDER — ALPRAZOLAM 1 MG PO TABS: 1.0000 mg | ORAL_TABLET | Freq: Every day | ORAL | 0 refills | Status: DC | PRN

## 2024-05-11 MED ORDER — OFLOXACIN 0.3 % OT SOLN: 5.0000 [drp] | Freq: Every day | OTIC | 0 refills | Status: DC

## 2024-05-11 NOTE — Assessment & Plan Note (Signed)
 Chronic sinusitis exacerbates with cold weather and respiratory symptoms, and he has allergies to grass and trees. A sinus CT was done in the past, prior ENT consultations did not offer surgical correction.  He has inconsistent use of intranasal steroids and lavage. Recommend daily sinus lavage with Navage and prescribed Flonase  for chronic inflammation. Continue daily Zyrtec. Can use Augmentin  for future severe exacerbations.

## 2024-05-11 NOTE — Progress Notes (Signed)
 Established Patient Office Visit  Patient ID: Adrian Ayers, male    DOB: [birth year]  Age: 63 y.o. MRN: [id number] PCP: Jerrell Adrian Ned, MD  Chief Complaint  Patient presents with   Anxiety    3 month follow up   PCV-Completed at the pharmacy  Flu- will complete at pharmacy  Covid- Will complete at pharmacy  Shingles-unsure     Subjective:     HPI  Discussed the use of AI scribe software for clinical note transcription with the patient, who gave verbal consent to proceed.  History of Present Illness Adrian Ayers is a 63 year old male who presents with recurrent sinus and ear infections.  He experiences recurrent sinus and ear infections annually, particularly during colder months, with symptoms including sinus congestion, ear pressure, chills, and feeling cold despite warm temperatures. He uses a peroxide-based earwash system to relieve ear pressure, which he finds effective.  Episodes of severe respiratory symptoms, including chills and feeling cold all over, occur annually between October and February. He recently received RSV and pneumonia vaccinations.  He has a history of a root canal performed 20-30 years ago, which he believes may contribute to his sinus issues. Persistent pain and swelling on one side of his face are associated with the root canal, and he has had all four teeth removed in that area due to complications.  He uses a Navaj sinus irrigation system as needed for sinus congestion and reports that regular use of intranasal steroids like Flonase  worsens his symptoms. He takes Zyrtec daily for allergies, which he believes are related to grass and trees.  He experiences anxiety, which is managed with Xanax , taking 30 tablets over a little more than a month. He has had issues with timely refills in the past.  He had a white spot on his tonsils three weeks ago, which has since resolved. He has been advised in the past to consider tonsillectomy but has  not pursued it due to age-related concerns.  He has worked in [employer] for 47 years, often in conditions with potential allergens and irritants.     Objective:     BP 139/81   Pulse (!) 55   Wt 195 lb (88.5 kg)   SpO2 100%   BMI 28.80 kg/m   Physical Exam  Gen: Well-appearing man Ears: Left ear canal is erythematous and uncomfortable, left tympanic membrane is erythematous and mildly bulging, no posterior effusion, right tympanic membrane and canal are normal Eyes: Mild asymmetry of the left eye compared to the right, extraocular movement looks symmetrical, looks like it is just mild facial asymmetry Neck: No tender lymphadenopathy, normal thyroid Heart: Regular, no murmur  Lungs: Unlabored, clear throughout    Assessment & Plan:   Problem List Items Addressed This Visit       High   GAD (generalized anxiety disorder) - Primary (Chronic)   His anxiety is well-managed with current medication, and he uses Alprazolam  as needed. Refill Alprazolam  1 mg as needed, 30 tablets per month.  I reviewed the controlled database which was appropriate.  Urine drug test today was appropriate.  Intolerant to SSRIs in the past.      Relevant Medications   ALPRAZolam  (XANAX ) 1 MG tablet   Other Relevant Orders   POCT Urine Drug Screen (Completed)     Medium    Mixed hyperlipidemia (Chronic)   He has mixed hyperlipidemia with a previous LDL of 152 mg/dL and is on daily rosuvastatin . Check cholesterol levels  today.      Relevant Orders   Lipid panel     Low   Chronic sinusitis (Chronic)   Chronic sinusitis exacerbates with cold weather and respiratory symptoms, and he has allergies to grass and trees. A sinus CT was done in the past, prior ENT consultations did not offer surgical correction.  He has inconsistent use of intranasal steroids and lavage. Recommend daily sinus lavage with Navage and prescribed Flonase  for chronic inflammation. Continue daily Zyrtec. Can use Augmentin  for future  severe exacerbations.      Relevant Medications   fluticasone  (FLONASE ) 50 MCG/ACT nasal spray   Otitis externa   He experiences chronic left ear discomfort with redness and mild inflammation, likely worsened by sinus congestion and ear cleaning. Prescribe Ciprodex ear drops and advise against inserting fingers into the ear canal.      Relevant Medications   ciprofloxacin-dexamethasone (CIPRODEX) OTIC suspension    Return in about 3 months (around 08/11/2024).    Adrian Debby Specking, MD [facility]

## 2024-05-11 NOTE — Assessment & Plan Note (Signed)
 He has mixed hyperlipidemia with a previous LDL of 152 mg/dL and is on daily rosuvastatin . Check cholesterol levels today.

## 2024-05-11 NOTE — Assessment & Plan Note (Addendum)
 His anxiety is well-managed with current medication, and he uses Alprazolam  as needed. Refill Alprazolam  1 mg as needed, 30 tablets per month.  I reviewed the controlled database which was appropriate.  Urine drug test today was appropriate.  Intolerant to SSRIs in the past.

## 2024-05-11 NOTE — Telephone Encounter (Signed)
 Copied from CRM [id number]. Topic: Clinical - Prescription Issue >> May 11, 2024  9:36 AM Franky GRADE wrote: Reason for CRM: Prudy at [facility] pharmacy is calling because they received a prescription for ciprofloxacin-dexamethasone (CIPRODEX) OTIC suspension [[id number]]; however, patients insurance does not cover it and they would like to know if there is an alternative that could be prescribed.

## 2024-05-11 NOTE — Telephone Encounter (Signed)
 Insurance will not cover Ciprofloxacin-dexamethasone ( CIPRODEX) OTIC, Pharmacy is questioning an alternative?

## 2024-05-11 NOTE — Telephone Encounter (Signed)
Patient advised understanding. 

## 2024-05-11 NOTE — Telephone Encounter (Signed)
 Yes.  We can try ofloxacin drops.  Prescription sent.

## 2024-05-11 NOTE — Patient Instructions (Signed)
  VISIT SUMMARY: Today, we discussed your recurrent sinus and ear infections, as well as your anxiety and cholesterol levels. We reviewed your symptoms, current treatments, and made some adjustments to your management plan to help improve your overall health.  YOUR PLAN: -CHRONIC SINUSITIS WITH RECURRENT ACUTE EXACERBATIONS AND ALLERGIC RHINITIS: Chronic sinusitis is a long-term inflammation of the sinuses that can worsen with cold weather and allergies. Continue using the Navaj sinus irrigation system daily and take Flonase  for chronic inflammation. Keep taking Zyrtec daily for allergies. If symptoms persist, consider seeing an allergist. Use Augmentin  for severe flare-ups.  -OTITIS EXTERNA, LEFT EAR: Otitis externa is an inflammation of the outer ear canal. Use Ciprodex ear drops as prescribed and avoid putting fingers in your ear to prevent further irritation.  -MIXED HYPERLIPIDEMIA: Mixed hyperlipidemia is a condition with high levels of different types of fats in the blood. Continue taking your daily rosuvastatin  and we will check your cholesterol levels today.  -GENERALIZED ANXIETY DISORDER: Generalized anxiety disorder is a condition characterized by excessive worry. Your anxiety is well-managed with your current medication. We will refill your Alprazolam  prescription, 1 mg as needed, 30 tablets per month.  INSTRUCTIONS: Please follow up with us  if your symptoms do not improve or if you experience any new issues. Consider seeing an allergist if your sinus and allergy  symptoms persist. We will check your cholesterol levels today and adjust your treatment if necessary.

## 2024-05-11 NOTE — Addendum Note (Signed)
 Addended by: JERRELL SOLIAN T on: 05/11/2024 01:20 PM   Modules accepted: Orders

## 2024-05-11 NOTE — Assessment & Plan Note (Signed)
 He experiences chronic left ear discomfort with redness and mild inflammation, likely worsened by sinus congestion and ear cleaning. Prescribe Ciprodex ear drops and advise against inserting fingers into the ear canal.

## 2024-06-25 ENCOUNTER — Other Ambulatory Visit: Payer: Self-pay | Admitting: Student in an Organized Health Care Education/Training Program

## 2024-06-25 DIAGNOSIS — F411 Generalized anxiety disorder: Secondary | ICD-10-CM

## 2024-06-25 MED ORDER — ALPRAZOLAM 1 MG PO TABS: 1.0000 mg | ORAL_TABLET | Freq: Every day | ORAL | 0 refills | Status: DC | PRN

## 2024-06-25 NOTE — Telephone Encounter (Signed)
 Copied from CRM [id number]. Topic: Clinical - Medication Refill >> Jun 25, 2024 12:24 PM Alfonso HERO wrote: Medication: ALPRAZolam  (XANAX ) 1 MG tablet  Has the patient contacted their pharmacy? Yes (Agent: If no, request that the patient contact the pharmacy for the refill. If patient does not wish to contact the pharmacy document the reason why and proceed with request.) (Agent: If yes, when and what did the pharmacy advise?)  This is the patient's preferred pharmacy:  [facility] [id number] - [place], [place] - [address] [place] [address] Phone: [phone] Fax: [phone]  Is this the correct pharmacy for this prescription? Yes If no, delete pharmacy and type the correct one.   Has the prescription been filled recently? Yes  Is the patient out of the medication? Yes  Has the patient been seen for an appointment in the last year OR does the patient have an upcoming appointment? Yes  Can we respond through MyChart? Yes  Agent: Please be advised that Rx refills may take up to 3 business days. We ask that you follow-up with your pharmacy.

## 2024-06-25 NOTE — Telephone Encounter (Signed)
 Requested Prescriptions   Pending Prescriptions Disp Refills   ALPRAZolam  (XANAX ) 1 MG tablet 30 tablet 0    Sig: Take 1 tablet (1 mg total) by mouth daily as needed. for anxiety     Date of patient request: 06/25/2024 Last office visit: 05/11/2024 Upcoming visit: 07/06/2024 Date of last refill: 05/11/2024 Last refill amount: 30 tabs 0 refills

## 2024-07-03 ENCOUNTER — Other Ambulatory Visit: Payer: Self-pay

## 2024-07-03 MED ORDER — ESOMEPRAZOLE MAGNESIUM 20 MG PO CPDR: 20.0000 mg | DELAYED_RELEASE_CAPSULE | Freq: Every day | ORAL | 1 refills | Status: AC

## 2024-07-06 ENCOUNTER — Ambulatory Visit: Admitting: Student in an Organized Health Care Education/Training Program

## 2024-07-24 ENCOUNTER — Ambulatory Visit: Admitting: Student in an Organized Health Care Education/Training Program

## 2024-07-24 ENCOUNTER — Encounter: Payer: Self-pay | Admitting: Student in an Organized Health Care Education/Training Program

## 2024-07-24 VITALS — BP 131/78 | HR 63 | Wt 189.0 lb

## 2024-07-24 DIAGNOSIS — J320 Chronic maxillary sinusitis: Secondary | ICD-10-CM

## 2024-07-24 DIAGNOSIS — F411 Generalized anxiety disorder: Secondary | ICD-10-CM

## 2024-07-24 DIAGNOSIS — K5904 Chronic idiopathic constipation: Secondary | ICD-10-CM | POA: Diagnosis not present

## 2024-07-24 DIAGNOSIS — K5900 Constipation, unspecified: Secondary | ICD-10-CM | POA: Insufficient documentation

## 2024-07-24 MED ORDER — FLUTICASONE PROPIONATE 50 MCG/ACT NA SUSP: 2.0000 | Freq: Every day | NASAL | 6 refills | Status: AC

## 2024-07-24 MED ORDER — ALPRAZOLAM 1 MG PO TABS: 1.0000 mg | ORAL_TABLET | Freq: Every day | ORAL | 0 refills | Status: AC | PRN

## 2024-07-24 NOTE — Progress Notes (Signed)
 "  Established Patient Office Visit  Patient ID: Adrian Ayers, male    DOB: 02-27-1961  Age: 64 y.o. MRN: [id number] PCP: Jerrell Adrian Ned, MD  Chief Complaint  Patient presents with   Anxiety    Would like to discuss constipation    Subjective:     HPI  Discussed the use of AI scribe software for clinical note transcription with the patient, who gave verbal consent to proceed.  History of Present Illness Adrian Ayers is a 64 year old male who presents with chronic constipation and associated abdominal pain.  He has experienced constipation for the past ten years, which began after his first colonoscopy. Bowel movements occur every three days, followed by two days of feeling 'stopped up'. Various treatments, including Miralax, fiber supplements, and a no lactose diet, have not provided lasting relief.  He experiences abdominal pain, which he associates with constipation. This pain was initially noted during his first colonoscopy, where three polyps were found. Subsequent colonoscopies revealed two polyps, and later ones were clear. He has undergone a total of three or four colonoscopies.  He is currently taking Xanax  for anxiety, taking approximately one pill a day, sometimes splitting it into half doses for use during the day and at night. No side effects such as falls, drowsiness, or confusion are reported.  He mentions using ear drops and nasal spray, although these are not chronic medications. His ears are sensitive to wind and cold, affecting his comfort.  He works in [employer] and maintains a diet rich in vegetables and chicken, avoiding fast food. He prepares meals at home and does not binge eat. No flu symptoms or side effects from current medications are reported. He confirms experiencing constipation and abdominal pain, but denies any other significant symptoms.     Objective:     BP 131/78   Pulse 63   Wt 189 lb (85.7 kg)   SpO2 100%   BMI 27.91 kg/m    Physical Exam  Gen: Well-appearing man Ears: Bilateral ear canals appear normal, no discomfort, tympanic membranes appear normal with no erythema and normal light reflex. Neck: Normal thyroid, no nodules or adenopathy Heart: Regular, no murmur Lungs: Unlabored, clear throughout Abd: Soft, mild tenderness in the left lower quadrants, no rebound or guarding, no organomegaly or masses Ext: Warm, no edema    Assessment & Plan:   Problem List Items Addressed This Visit       High   GAD (generalized anxiety disorder) - Primary (Chronic)   Symptoms are effectively controlled with Alprazolam  without side effects. Continue Alprazolam  1 MG orally as needed.  I reviewed the database which was appropriate.  Last toxicology was appropriate.  She has been doing well on this regimen, intolerant of SSRIs in the past.      Relevant Medications   ALPRAZolam  (XANAX ) 1 MG tablet     Low   Chronic sinusitis (Chronic)   Chronic and stable.  Found to benefit to starting Flonase  on a daily basis.  Will continue.      Relevant Medications   fluticasone  (FLONASE ) 50 MCG/ACT nasal spray   Constipation (Chronic)   He has a ten-year history of chronic idiopathic constipation with previous treatments proving ineffective. No medication-induced constipation identified. Emphasized healthy dietary changes and I recommended use of Miralax and Senna on a as needed basis for a goal of 1 bowel movement about every 1-2 days. Advised increased intake of fruits, vegetables, whole grains, and lean proteins.  Return in about 3 months (around 10/22/2024).    Adrian Debby Specking, MD [facility] at [facility]   "

## 2024-07-24 NOTE — Assessment & Plan Note (Signed)
 Symptoms are effectively controlled with Alprazolam  without side effects. Continue Alprazolam  1 MG orally as needed.  I reviewed the database which was appropriate.  Last toxicology was appropriate.  She has been doing well on this regimen, intolerant of SSRIs in the past.

## 2024-07-24 NOTE — Patient Instructions (Signed)
" °  VISIT SUMMARY: Today, we discussed your ongoing issues with chronic constipation and associated abdominal pain, as well as your management of generalized anxiety disorder. We reviewed your current treatments and made some adjustments to help improve your symptoms.  YOUR PLAN: -CHRONIC IDIOPATHIC CONSTIPATION: Chronic idiopathic constipation is a long-term condition where you have infrequent bowel movements without a known cause. We recommend continuing with Miralax and adding Senna to your daily routine to help with bowel regularity. Additionally, try to increase your intake of fruits, vegetables, whole grains, and lean proteins to support digestive health.  -GENERALIZED ANXIETY DISORDER: Generalized anxiety disorder is a condition characterized by persistent and excessive worry. Your symptoms are well-controlled with your current medication, Alprazolam . Continue taking Alprazolam  1 MG as needed, and monitor for any potential side effects.  INSTRUCTIONS: Please follow the new recommendations for managing your constipation, including the use of Miralax and Senna daily, and increasing your intake of fruits, vegetables, whole grains, and lean proteins. Continue taking Alprazolam  as needed for anxiety. If you experience any new symptoms or side effects, please contact our office. Schedule a follow-up appointment in 3 months to review your progress.   "

## 2024-07-24 NOTE — Assessment & Plan Note (Signed)
 Chronic and stable.  Found to benefit to starting Flonase  on a daily basis.  Will continue.

## 2024-07-24 NOTE — Assessment & Plan Note (Signed)
 He has a ten-year history of chronic idiopathic constipation with previous treatments proving ineffective. No medication-induced constipation identified. Emphasized healthy dietary changes and I recommended use of Miralax and Senna on a as needed basis for a goal of 1 bowel movement about every 1-2 days. Advised increased intake of fruits, vegetables, whole grains, and lean proteins.

## 2024-10-24 ENCOUNTER — Ambulatory Visit: Admitting: Student in an Organized Health Care Education/Training Program
